# Patient Record
Sex: Female | Born: 1990 | Hispanic: No | Marital: Married | Smoking: Never smoker
Health system: Southern US, Community
[De-identification: ages and names within clinical notes are randomized; demographics above are authoritative.]

## PROBLEM LIST (undated history)

## (undated) DIAGNOSIS — A630 Anogenital (venereal) warts: Secondary | ICD-10-CM

## (undated) DIAGNOSIS — R87629 Unspecified abnormal cytological findings in specimens from vagina: Secondary | ICD-10-CM

## (undated) DIAGNOSIS — N6320 Unspecified lump in the left breast, unspecified quadrant: Secondary | ICD-10-CM

## (undated) HISTORY — PX: COLPOSCOPY: SHX161

## (undated) HISTORY — PX: NO PAST SURGERIES: SHX2092

## (undated) HISTORY — DX: Unspecified abnormal cytological findings in specimens from vagina: R87.629

---

## 2018-09-12 LAB — OB RESULTS CONSOLE HIV ANTIBODY (ROUTINE TESTING): HIV: NONREACTIVE

## 2018-09-12 LAB — OB RESULTS CONSOLE ABO/RH: RH Type: POSITIVE

## 2018-09-12 LAB — OB RESULTS CONSOLE GC/CHLAMYDIA
Chlamydia: NEGATIVE
Gonorrhea: NEGATIVE

## 2018-09-12 LAB — OB RESULTS CONSOLE RUBELLA ANTIBODY, IGM: Rubella: IMMUNE

## 2018-09-12 LAB — OB RESULTS CONSOLE ANTIBODY SCREEN: Antibody Screen: NEGATIVE

## 2018-09-12 LAB — OB RESULTS CONSOLE RPR: RPR: NONREACTIVE

## 2018-09-12 LAB — OB RESULTS CONSOLE HEPATITIS B SURFACE ANTIGEN: Hepatitis B Surface Ag: NEGATIVE

## 2018-09-13 ENCOUNTER — Other Ambulatory Visit (HOSPITAL_COMMUNITY): Payer: Self-pay | Admitting: Nurse Practitioner

## 2018-09-13 DIAGNOSIS — Z3A13 13 weeks gestation of pregnancy: Secondary | ICD-10-CM

## 2018-09-13 DIAGNOSIS — Z369 Encounter for antenatal screening, unspecified: Secondary | ICD-10-CM

## 2018-09-20 ENCOUNTER — Encounter (HOSPITAL_COMMUNITY): Payer: Self-pay | Admitting: *Deleted

## 2018-09-23 ENCOUNTER — Other Ambulatory Visit: Payer: Self-pay | Admitting: Nurse Practitioner

## 2018-09-23 DIAGNOSIS — N6320 Unspecified lump in the left breast, unspecified quadrant: Secondary | ICD-10-CM

## 2018-09-24 ENCOUNTER — Encounter (HOSPITAL_COMMUNITY): Payer: Self-pay

## 2018-09-24 ENCOUNTER — Ambulatory Visit (HOSPITAL_COMMUNITY)
Admission: RE | Admit: 2018-09-24 | Discharge: 2018-09-24 | Disposition: A | Discharge status: Home / Self Care | Payer: Medicaid Other | Source: Ambulatory Visit | Attending: Nurse Practitioner | Admitting: Nurse Practitioner

## 2018-09-24 ENCOUNTER — Other Ambulatory Visit (HOSPITAL_COMMUNITY): Payer: Self-pay | Admitting: Nurse Practitioner

## 2018-09-24 DIAGNOSIS — Z3A13 13 weeks gestation of pregnancy: Secondary | ICD-10-CM

## 2018-09-24 DIAGNOSIS — Z369 Encounter for antenatal screening, unspecified: Secondary | ICD-10-CM

## 2018-09-24 DIAGNOSIS — Z363 Encounter for antenatal screening for malformations: Secondary | ICD-10-CM | POA: Diagnosis not present

## 2018-09-24 HISTORY — DX: Anogenital (venereal) warts: A63.0

## 2018-09-24 HISTORY — DX: Unspecified lump in the left breast, unspecified quadrant: N63.20

## 2018-10-04 ENCOUNTER — Ambulatory Visit
Admission: RE | Admit: 2018-10-04 | Discharge: 2018-10-04 | Disposition: A | Discharge status: Home / Self Care | Payer: Medicaid Other | Source: Ambulatory Visit | Attending: Nurse Practitioner | Admitting: Nurse Practitioner

## 2018-10-04 DIAGNOSIS — N6320 Unspecified lump in the left breast, unspecified quadrant: Secondary | ICD-10-CM

## 2018-11-12 ENCOUNTER — Encounter: Payer: Self-pay | Admitting: *Deleted

## 2018-12-11 NOTE — L&D Delivery Note (Signed)
Patient: Lisa Heath MRN: [id number]  GBS status: Positive, IAP given: PCN   Patient is a 28 y.o. now G1P1 s/p NSVD at [redacted]w[redacted]d, who was admitted for PROM. SROM 32 hours prior to delivery with clear fluid.    Delivery Note At 3:54 PM a viable female was delivered via SVD (Presentation: LOA).  APGAR: 8, 9; weight 7 lb 14.8 oz (3595 g).   Placenta status: spontaneous, intact.  Cord: 3 vessel with the following complications: nuchal x1.   Anesthesia:  Lidocaine 20 cc for repair, Epidural turned off 4 hours prior to delivery  Episiotomy: None Lacerations:  2nd degree perineal, right sulcus  Suture Repair: 3.0 monocryl  Est. Blood Loss (mL):  715   Poor maternal effort with pushing, very disorganized pattern without relation to contractions. Attempted vacuum after pushing with patient for 1 hour (total pushing time close to 3 hours) where head was +3 station but was unable to maintain adequate suction with Kiwi and Mushroom vacuum. Turned patient to left side to push. When returned to supine position, head delivered LOA. Nuchal cord present x1, reduced at periuneum. Shoulder and body delivered in usual fashion. Infant with initial poor tone and no spontaneous cry, NICU team in room, cord cut and clamped immediately and baby taken to the warmer. As soon as cord cut, infant with spontaneous cry. Cord blood drawn. Placenta delivered spontaneously with gentle cord traction. Fundus very boggy despite massage and Pitocin. Patient was given 1000 mg Cytotec rectally and Methergine 0.2 mg IM with significant improvement in tone and bleeding. Perineum and vagina inspected and found to have second degree laceration and left sulcus tear, which was repaired with 3.0 monocryl with good hemostasis achieved.   Mom to postpartum.  Baby to Couplet care / Skin to Skin.  De Hollingshead 04/04/2019, 4:56 PM

## 2018-12-16 ENCOUNTER — Ambulatory Visit (INDEPENDENT_AMBULATORY_CARE_PROVIDER_SITE_OTHER): Payer: Medicaid Other | Admitting: Obstetrics & Gynecology

## 2018-12-16 ENCOUNTER — Encounter: Payer: Self-pay | Admitting: Obstetrics & Gynecology

## 2018-12-16 VITALS — BP 95/62 | HR 92 | Wt 128.8 lb

## 2018-12-16 DIAGNOSIS — R87610 Atypical squamous cells of undetermined significance on cytologic smear of cervix (ASC-US): Secondary | ICD-10-CM | POA: Diagnosis not present

## 2018-12-16 DIAGNOSIS — Z789 Other specified health status: Secondary | ICD-10-CM

## 2018-12-16 DIAGNOSIS — R87810 Cervical high risk human papillomavirus (HPV) DNA test positive: Secondary | ICD-10-CM | POA: Diagnosis not present

## 2018-12-16 NOTE — Progress Notes (Signed)
FHR 142 by doppler

## 2018-12-16 NOTE — Progress Notes (Signed)
   Subjective:    Patient ID: Lisa Heath, female    DOB: [birth year], 28 y.o.   MRN: [id number]  HPI 28 yo P1 in her third trimester referred from the health dept for a colpo due to a ASCUS + HR HPV pap done 10/19. This is her first pap smear. She is not having any obstetric problems today.  Review of Systems     Objective:   Physical Exam Breathing, conversing, and ambulating normally Live interpretor present for exam UPT negative, consent signed, time out done Cervix prepped with acetic acid. Transformation zone seen in its entirety. Colpo adequate. 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 She has a large ectropion. She tolerated the procedure well.     Assessment & Plan:  ASCUS + HR HPV pap, colpo c/w LGSIL Rec repeat pap smear postpartum.

## 2018-12-17 ENCOUNTER — Encounter: Payer: Self-pay | Admitting: *Deleted

## 2019-04-01 ENCOUNTER — Other Ambulatory Visit: Payer: Self-pay | Admitting: Advanced Practice Midwife

## 2019-04-01 ENCOUNTER — Telehealth (HOSPITAL_COMMUNITY): Payer: Self-pay | Admitting: *Deleted

## 2019-04-01 LAB — OB RESULTS CONSOLE GBS: GBS: POSITIVE

## 2019-04-01 NOTE — Telephone Encounter (Signed)
Preadmission screen Interpreter number [phone]

## 2019-04-03 ENCOUNTER — Encounter (HOSPITAL_COMMUNITY): Payer: Self-pay | Admitting: *Deleted

## 2019-04-03 ENCOUNTER — Other Ambulatory Visit: Payer: Self-pay

## 2019-04-03 ENCOUNTER — Inpatient Hospital Stay (HOSPITAL_COMMUNITY)
Admission: AD | Admit: 2019-04-03 | Discharge: 2019-04-06 | DRG: 806 | Disposition: A | Discharge status: Home / Self Care | Payer: Medicaid Other | Attending: Obstetrics & Gynecology | Admitting: Obstetrics & Gynecology

## 2019-04-03 DIAGNOSIS — O99824 Streptococcus B carrier state complicating childbirth: Secondary | ICD-10-CM | POA: Diagnosis present

## 2019-04-03 DIAGNOSIS — O26893 Other specified pregnancy related conditions, third trimester: Secondary | ICD-10-CM | POA: Diagnosis present

## 2019-04-03 DIAGNOSIS — O4202 Full-term premature rupture of membranes, onset of labor within 24 hours of rupture: Secondary | ICD-10-CM | POA: Diagnosis not present

## 2019-04-03 DIAGNOSIS — Z3A40 40 weeks gestation of pregnancy: Secondary | ICD-10-CM

## 2019-04-03 DIAGNOSIS — O480 Post-term pregnancy: Secondary | ICD-10-CM | POA: Diagnosis present

## 2019-04-03 DIAGNOSIS — O99820 Streptococcus B carrier state complicating pregnancy: Secondary | ICD-10-CM

## 2019-04-03 DIAGNOSIS — O4290 Premature rupture of membranes, unspecified as to length of time between rupture and onset of labor, unspecified weeks of gestation: Secondary | ICD-10-CM

## 2019-04-03 DIAGNOSIS — O4292 Full-term premature rupture of membranes, unspecified as to length of time between rupture and onset of labor: Principal | ICD-10-CM | POA: Diagnosis present

## 2019-04-03 DIAGNOSIS — Z3A41 41 weeks gestation of pregnancy: Secondary | ICD-10-CM

## 2019-04-03 DIAGNOSIS — Z8759 Personal history of other complications of pregnancy, childbirth and the puerperium: Secondary | ICD-10-CM | POA: Diagnosis not present

## 2019-04-03 HISTORY — DX: Streptococcus B carrier state complicating pregnancy: O99.820

## 2019-04-03 LAB — CBC
HCT: 35.4 % — ABNORMAL LOW (ref 36.0–46.0)
Hemoglobin: 12 g/dL (ref 12.0–15.0)
MCH: 28.4 pg (ref 26.0–34.0)
MCHC: 33.9 g/dL (ref 30.0–36.0)
MCV: 83.7 fL (ref 80.0–100.0)
Platelets: 157 10*3/uL (ref 150–400)
RBC: 4.23 MIL/uL (ref 3.87–5.11)
RDW: 13.6 % (ref 11.5–15.5)
WBC: 9.1 10*3/uL (ref 4.0–10.5)
nRBC: 0 % (ref 0.0–0.2)

## 2019-04-03 LAB — ABO/RH: ABO/RH(D): O POS

## 2019-04-03 LAB — TYPE AND SCREEN
ABO/RH(D): O POS
Antibody Screen: NEGATIVE

## 2019-04-03 LAB — POCT FERN TEST: POCT Fern Test: POSITIVE

## 2019-04-03 MED ORDER — LACTATED RINGERS IV SOLN
INTRAVENOUS | Status: DC
  Administered 2019-04-03 (×2): via INTRAVENOUS

## 2019-04-03 MED ORDER — ACETAMINOPHEN 325 MG PO TABS
650.0000 mg | ORAL_TABLET | ORAL | Status: DC | PRN
  Administered 2019-04-04: 650 mg via ORAL
  Filled 2019-04-03: qty 2

## 2019-04-03 MED ORDER — OXYCODONE-ACETAMINOPHEN 5-325 MG PO TABS: 2.0000 | ORAL_TABLET | ORAL | Status: DC | PRN

## 2019-04-03 MED ORDER — LIDOCAINE HCL (PF) 1 % IJ SOLN: 30.0000 mL | INTRAMUSCULAR | Status: DC | PRN

## 2019-04-03 MED ORDER — SODIUM CHLORIDE 0.9 % IV SOLN
5.0000 10*6.[IU] | Freq: Once | INTRAVENOUS | Status: AC
  Administered 2019-04-03: 14:00:00 5 10*6.[IU] via INTRAVENOUS
  Filled 2019-04-03: qty 5

## 2019-04-03 MED ORDER — PENICILLIN G 3 MILLION UNITS IVPB - SIMPLE MED
3.0000 10*6.[IU] | INTRAVENOUS | Status: DC
  Administered 2019-04-03 – 2019-04-04 (×4): 3 10*6.[IU] via INTRAVENOUS
  Filled 2019-04-03 (×4): qty 100

## 2019-04-03 MED ORDER — LACTATED RINGERS IV SOLN: 500.0000 mL | INTRAVENOUS | Status: DC | PRN

## 2019-04-03 MED ORDER — OXYTOCIN 40 UNITS IN NORMAL SALINE INFUSION - SIMPLE MED
1.0000 m[IU]/min | INTRAVENOUS | Status: DC
  Administered 2019-04-03: 2 m[IU]/min via INTRAVENOUS
  Filled 2019-04-03: qty 1000

## 2019-04-03 MED ORDER — OXYTOCIN 40 UNITS IN NORMAL SALINE INFUSION - SIMPLE MED: 2.5000 [IU]/h | INTRAVENOUS | Status: DC

## 2019-04-03 MED ORDER — FENTANYL CITRATE (PF) 100 MCG/2ML IJ SOLN
100.0000 ug | INTRAMUSCULAR | Status: DC | PRN
  Administered 2019-04-04: 100 ug via INTRAVENOUS
  Filled 2019-04-03: qty 2

## 2019-04-03 MED ORDER — OXYCODONE-ACETAMINOPHEN 5-325 MG PO TABS: 1.0000 | ORAL_TABLET | ORAL | Status: DC | PRN

## 2019-04-03 MED ORDER — ONDANSETRON HCL 4 MG/2ML IJ SOLN: 4.0000 mg | Freq: Four times a day (QID) | INTRAMUSCULAR | Status: DC | PRN

## 2019-04-03 MED ORDER — OXYTOCIN BOLUS FROM INFUSION
500.0000 mL | Freq: Once | INTRAVENOUS | Status: AC
  Administered 2019-04-04: 500 mL via INTRAVENOUS

## 2019-04-03 MED ORDER — SOD CITRATE-CITRIC ACID 500-334 MG/5ML PO SOLN: 30.0000 mL | ORAL | Status: DC | PRN

## 2019-04-03 MED ORDER — TERBUTALINE SULFATE 1 MG/ML IJ SOLN: 0.2500 mg | Freq: Once | INTRAMUSCULAR | Status: DC | PRN

## 2019-04-03 MED ORDER — PHENYLEPHRINE HCL-NACL 20-0.9 MG/250ML-% IV SOLN
INTRAVENOUS | Status: AC
  Filled 2019-04-03: qty 250

## 2019-04-03 MED ORDER — MISOPROSTOL 50MCG HALF TABLET
50.0000 ug | ORAL_TABLET | ORAL | Status: DC | PRN
  Administered 2019-04-03: 15:00:00 50 ug via BUCCAL
  Filled 2019-04-03: qty 1

## 2019-04-03 MED ORDER — ZOLPIDEM TARTRATE 5 MG PO TABS: 5.0000 mg | ORAL_TABLET | Freq: Every evening | ORAL | Status: DC | PRN

## 2019-04-03 NOTE — Progress Notes (Addendum)
Lisa Heath is a 28 y.o. G1P0 at [redacted]w[redacted]d admitted for SROM today at 0800.  Subjective: Denies pain, desires unmedicated labor.  Objective: BP 106/68   Pulse 79   Temp 98.1 F (36.7 C) (Oral)   Resp 16   Ht 5' 3.5" (1.613 m)   Wt 64 kg   LMP 06/23/2018   BMI 24.59 kg/m  No intake/output data recorded. No intake/output data recorded.  FHT:  FHR: 130 bpm, variability: moderate,  accelerations:  Present,  decelerations:  Absent UC:   none SVE:   Dilation: 1 Effacement (%): 40 Station: Ballotable Exam by:: Thalia Bloodgood, CNM  Labs: Lab Results  Component Value Date   WBC 9.1 04/03/2019   HGB 12.0 04/03/2019   HCT 35.4 (L) 04/03/2019   MCV 83.7 04/03/2019   PLT 157 04/03/2019    Assessment / Plan: SROM today 0800 Category I tracing Vertex by suture GBS +. PCN G 5 million units hung at 1346 Foley bulb and 50 mcg buccal Cytotec administered at 1515. Tolerated well by patient Anticipate NSVD  Calvert Cantor, CNM 04/03/2019, 3:17 PM

## 2019-04-03 NOTE — Progress Notes (Signed)
OB/GYN Faculty Practice: Labor Progress Note  Subjective: Doing well, NAD. Having contractions on toco every minute. States feeling them.   Objective: BP 110/69   Pulse 81   Temp 98.6 F (37 C) (Oral)   Resp 17   Ht 5' 3.5" (1.613 m)   Wt 64 kg   LMP 06/23/2018   BMI 24.59 kg/m  Gen: comfortable appearing, no changes with contractions Dilation: 1 Effacement (%): 40 Cervical Position: Posterior Station: Ballotable Presentation: Vertex Exam by:: Thalia Bloodgood, CNM  Assessment and Plan: 28 y.o. G1P0 [redacted]w[redacted]d here with PROM this morning around 0800.   Labor: PROM this morning. FB placed and now out, received one dose of cytotec around 1515 and still contracting every 1-2 minutes. Does not appear uncomfortable.  -- pain control: would like to be intervention free -- PPH Risk: low  Fetal Well-Being: EFW 6-7lbs by Leopold's. Cephalic by prior check with sutures.  -- Category I - continuous fetal monitoring  -- GBS positive (has received 2 doses)   Laurel S. Earlene Plater, DO OB/GYN Fellow, Faculty Practice  9:18 PM

## 2019-04-03 NOTE — H&P (Addendum)
Lisa Heath is a 28 y.o. female, G1P0 at 40.4 weeks, presenting for SROM at 0800.  She reports clear fluid, fetal movement, and occasional contractions.  [facility] and was supervised for a Low risk pregnancy. Pregnancy and medical history significant for problems as listed below. Patient is GBS Positive and desires an epidural for pain management.  Patient is anticipating a female infant and desires a circumcision.  She opts for oral [facility].    Patient Active Problem List   Diagnosis Date Noted  . GBS (group B Streptococcus carrier), +RV culture, currently pregnant 04/03/2019  . Language barrier affecting health care 12/16/2018  . ASCUS with positive high risk HPV cervical 12/16/2018    History of present pregnancy: Patient entered care at 11.4 weeks.   EDC of 03/30/2019 was established by Definite LMP 06/23/2018.   Anatomy scan:  20.1 weeks, with normal findings and an anterior placenta.   Additional US evaluations:  None Significant prenatal events: US of Left breast for lump.    OB History    Gravida  1   Para      Term      Preterm      AB      Living  0     SAB      TAB      Ectopic      Multiple      Live Births                Past Medical History:  Diagnosis Date  . Left breast mass   . Venereal warts    Past Surgical History:  Procedure Laterality Date  . NO PAST SURGERIES     Family History: family history is not on file. Social History:  reports that she has never smoked. She has never used smokeless tobacco. She reports that she does not drink alcohol or use drugs.   Prenatal Transfer Tool  Maternal Diabetes: No Genetic Screening: Normal Maternal Ultrasounds/Referrals: Normal Fetal Ultrasounds or other Referrals:  None Maternal Substance Abuse:  No Significant Maternal Medications:  None Significant Maternal Lab Results: Lab values include: Group B Strep positive   Maternal Assessment:  ROS:  +Contractions, +LOF, -Vaginal Bleeding, +Fetal Movement  All other systems reviewed and negative.    No Known Allergies   Dilation: Closed Effacement (%): 50 Station: Ballotable Exam by:: Sabas SousJ. Shyann Hefner CNM Blood pressure 112/72, pulse 100, temperature 98.4 F (36.9 C), resp. rate 18, height 5' 3.5" (1.613 m), weight 64 kg, last menstrual period 06/23/2018.  Physical Exam  Constitutional: She is oriented to person, place, and time. She appears well-developed and well-nourished. No distress.  HENT:  Head: Normocephalic and atraumatic.  Eyes: Conjunctivae are normal.  Neck: Normal range of motion.  Cardiovascular: Normal rate, regular rhythm and normal heart sounds.  Respiratory: Effort normal and breath sounds normal.  GI: Soft.  Gravid--fundal height appears AGA, Soft, NT  Genitourinary:    Vagina normal.  Cervix exhibits no motion tenderness, no discharge and no friability.    No vaginal bleeding.  No bleeding in the vagina.    Genitourinary Comments: Sterile Speculum Exam: -Vaginal Vault: Pink mucosa.  Small amt clear mucoid/watery discharge in posterior fornix; fern collected -Cervix:Pink, no lesions, cysts, or polyps.  Appears closed. No active bleeding from os, but active leakage of fluid -Bimanual Exam: Dilation: Closed Effacement (%): 50 Cervical Position: Posterior Station: Ballotable Exam by:: Sabas SousJ. Makelle Marrone CNM    Musculoskeletal: Normal range  of motion.  Neurological: She is alert and oriented to person, place, and time.  Skin: Skin is warm and dry.  Psychiatric: She has a normal mood and affect. Her behavior is normal.    Fetal Assessment: Leopolds: -Pelvis:Adequate -EFW: 7lbs -Presentation:Vertex by BS US-Appears slightly asynclitic   FHR: 145 bpm, Mod Var, -Decels, +Accels UCs:  q1-41min, palpates mild    Assessment IUP at 40.4 weeks Cat I FT SROM GBS Positive Language Barrier-Arabic  Plan: Admit to [employer]  Routine Labor and Delivery Orders per  Protocol  Expectant Mgmt at current Start PCN for GBS prophylaxis  Joellyn Quails, MSN 04/03/2019, 11:29 AM

## 2019-04-03 NOTE — Progress Notes (Signed)
Lisa Heath is a 28 y.o. G1P0 at [redacted]w[redacted]d admitted for SROM today at 0800.  Subjective: Patient reporting "uncomfortable" contraction pain. Declines pain medication at this time.  Objective: BP 109/72   Pulse 81   Temp 98.5 F (36.9 C) (Oral)   Resp 18   Ht 5' 3.5" (1.613 m)   Wt 64 kg   LMP 06/23/2018   BMI 24.59 kg/m  No intake/output data recorded. No intake/output data recorded.  FHT:  FHR: 140 bpm, variability: moderate,  accelerations:  Present,  decelerations:  Absent UC:   regular, every 1-3 minutes SVE:   Dilation: 1 (with foley bulb placement) Effacement (%): 40 Station: Ballotable Exam by:: Thalia Bloodgood, CNM  Labs: Lab Results  Component Value Date   WBC 9.1 04/03/2019   HGB 12.0 04/03/2019   HCT 35.4 (L) 04/03/2019   MCV 83.7 04/03/2019   PLT 157 04/03/2019    Assessment / Plan: Category I tracing Foley bulb dislodged at 1745 S/P Cytotec #1 at 1519 GBS +: PCN 2 million units given at 1742 Defer cervical exam due to rupture Patient desires unmedicated delivery Plan Pitocin augmentation PRN if contractions space out Anticipate SVD  Calvert Cantor, CNM 04/03/2019, 7:26 PM

## 2019-04-03 NOTE — MAU Note (Signed)
Water broke at 8am this morning Clear fluid. Denies any contractions. Good fetal movment

## 2019-04-04 ENCOUNTER — Encounter (HOSPITAL_COMMUNITY): Payer: Medicaid Other

## 2019-04-04 ENCOUNTER — Inpatient Hospital Stay (HOSPITAL_COMMUNITY): Payer: Medicaid Other | Admitting: Anesthesiology

## 2019-04-04 ENCOUNTER — Encounter (HOSPITAL_COMMUNITY): Payer: Self-pay | Admitting: *Deleted

## 2019-04-04 DIAGNOSIS — O4202 Full-term premature rupture of membranes, onset of labor within 24 hours of rupture: Secondary | ICD-10-CM

## 2019-04-04 DIAGNOSIS — Z8759 Personal history of other complications of pregnancy, childbirth and the puerperium: Secondary | ICD-10-CM | POA: Diagnosis not present

## 2019-04-04 DIAGNOSIS — O99824 Streptococcus B carrier state complicating childbirth: Secondary | ICD-10-CM

## 2019-04-04 DIAGNOSIS — Z3A40 40 weeks gestation of pregnancy: Secondary | ICD-10-CM

## 2019-04-04 DIAGNOSIS — O480 Post-term pregnancy: Secondary | ICD-10-CM

## 2019-04-04 LAB — RPR: RPR Ser Ql: NONREACTIVE

## 2019-04-04 MED ORDER — IBUPROFEN 600 MG PO TABS
600.0000 mg | ORAL_TABLET | Freq: Four times a day (QID) | ORAL | Status: DC
  Administered 2019-04-04 – 2019-04-06 (×6): 600 mg via ORAL
  Filled 2019-04-04 (×7): qty 1

## 2019-04-04 MED ORDER — LACTATED RINGERS IV SOLN: 500.0000 mL | Freq: Once | INTRAVENOUS | Status: DC

## 2019-04-04 MED ORDER — ONDANSETRON HCL 4 MG PO TABS: 4.0000 mg | ORAL_TABLET | ORAL | Status: DC | PRN

## 2019-04-04 MED ORDER — ONDANSETRON HCL 4 MG/2ML IJ SOLN: 4.0000 mg | INTRAMUSCULAR | Status: DC | PRN

## 2019-04-04 MED ORDER — SENNOSIDES-DOCUSATE SODIUM 8.6-50 MG PO TABS
2.0000 | ORAL_TABLET | ORAL | Status: DC
  Administered 2019-04-05 (×2): 2 via ORAL
  Filled 2019-04-04 (×2): qty 2

## 2019-04-04 MED ORDER — EPHEDRINE 5 MG/ML INJ: 10.0000 mg | INTRAVENOUS | Status: DC | PRN

## 2019-04-04 MED ORDER — SIMETHICONE 80 MG PO CHEW: 80.0000 mg | CHEWABLE_TABLET | ORAL | Status: DC | PRN

## 2019-04-04 MED ORDER — PHENYLEPHRINE 40 MCG/ML (10ML) SYRINGE FOR IV PUSH (FOR BLOOD PRESSURE SUPPORT)
80.0000 ug | PREFILLED_SYRINGE | INTRAVENOUS | Status: DC | PRN
  Filled 2019-04-04: qty 10

## 2019-04-04 MED ORDER — MISOPROSTOL 200 MCG PO TABS
ORAL_TABLET | ORAL | Status: AC
  Filled 2019-04-04: qty 2

## 2019-04-04 MED ORDER — MEASLES, MUMPS & RUBELLA VAC IJ SOLR: 0.5000 mL | Freq: Once | INTRAMUSCULAR | Status: DC

## 2019-04-04 MED ORDER — DIBUCAINE (PERIANAL) 1 % EX OINT
1.0000 "application " | TOPICAL_OINTMENT | CUTANEOUS | Status: DC | PRN
  Administered 2019-04-05: 1 via RECTAL
  Filled 2019-04-04: qty 28

## 2019-04-04 MED ORDER — DIPHENHYDRAMINE HCL 50 MG/ML IJ SOLN: 12.5000 mg | INTRAMUSCULAR | Status: DC | PRN

## 2019-04-04 MED ORDER — PRENATAL MULTIVITAMIN CH
1.0000 | ORAL_TABLET | Freq: Every day | ORAL | Status: DC
  Administered 2019-04-05: 15:00:00 1 via ORAL
  Filled 2019-04-04 (×2): qty 1

## 2019-04-04 MED ORDER — LIDOCAINE HCL (PF) 1 % IJ SOLN
INTRAMUSCULAR | Status: DC | PRN
  Administered 2019-04-04: 5 mL via EPIDURAL

## 2019-04-04 MED ORDER — SODIUM CHLORIDE (PF) 0.9 % IJ SOLN
INTRAMUSCULAR | Status: DC | PRN
  Administered 2019-04-04: 12 mL/h via EPIDURAL

## 2019-04-04 MED ORDER — METHYLERGONOVINE MALEATE 0.2 MG/ML IJ SOLN
INTRAMUSCULAR | Status: AC
  Administered 2019-04-04: 0.2 mg
  Filled 2019-04-04: qty 1

## 2019-04-04 MED ORDER — COCONUT OIL OIL: 1.0000 "application " | TOPICAL_OIL | Status: DC | PRN

## 2019-04-04 MED ORDER — FENTANYL-BUPIVACAINE-NACL 0.5-0.125-0.9 MG/250ML-% EP SOLN
12.0000 mL/h | EPIDURAL | Status: DC | PRN
  Filled 2019-04-04: qty 250

## 2019-04-04 MED ORDER — ACETAMINOPHEN 325 MG PO TABS
650.0000 mg | ORAL_TABLET | ORAL | Status: DC | PRN
  Administered 2019-04-06: 07:00:00 650 mg via ORAL
  Filled 2019-04-04: qty 2

## 2019-04-04 MED ORDER — LIDOCAINE HCL (PF) 1 % IJ SOLN
INTRAMUSCULAR | Status: AC
  Filled 2019-04-04: qty 30

## 2019-04-04 MED ORDER — MISOPROSTOL 200 MCG PO TABS
ORAL_TABLET | ORAL | Status: AC
  Administered 2019-04-04: 1000 ug
  Filled 2019-04-04: qty 3

## 2019-04-04 MED ORDER — TETANUS-DIPHTH-ACELL PERTUSSIS 5-2.5-18.5 LF-MCG/0.5 IM SUSP: 0.5000 mL | Freq: Once | INTRAMUSCULAR | Status: DC

## 2019-04-04 MED ORDER — ZOLPIDEM TARTRATE 5 MG PO TABS: 5.0000 mg | ORAL_TABLET | Freq: Every evening | ORAL | Status: DC | PRN

## 2019-04-04 MED ORDER — WITCH HAZEL-GLYCERIN EX PADS
1.0000 "application " | MEDICATED_PAD | CUTANEOUS | Status: DC | PRN
  Administered 2019-04-05: 1 via TOPICAL

## 2019-04-04 MED ORDER — DIPHENHYDRAMINE HCL 25 MG PO CAPS: 25.0000 mg | ORAL_CAPSULE | Freq: Four times a day (QID) | ORAL | Status: DC | PRN

## 2019-04-04 MED ORDER — PHENYLEPHRINE 40 MCG/ML (10ML) SYRINGE FOR IV PUSH (FOR BLOOD PRESSURE SUPPORT): 80.0000 ug | PREFILLED_SYRINGE | INTRAVENOUS | Status: DC | PRN

## 2019-04-04 MED ORDER — BENZOCAINE-MENTHOL 20-0.5 % EX AERO
1.0000 "application " | INHALATION_SPRAY | CUTANEOUS | Status: DC | PRN
  Administered 2019-04-05: 1 via TOPICAL
  Filled 2019-04-04: qty 56

## 2019-04-04 NOTE — Progress Notes (Signed)
OB/GYN Faculty Practice: Labor Progress Note  Subjective: Comfortable with epidural. Did short trial of pushing without much fetal descent, dense epidural.   Objective: BP (!) 99/53   Pulse 75   Temp 99.6 F (37.6 C) (Oral)   Resp 16   Ht 5' 3.5" (1.613 m)   Wt 64 kg   LMP 06/23/2018   SpO2 100%   BMI 24.59 kg/m  Gen: comfortable appearing Dilation: 10 Dilation Complete Date: 04/04/19 Dilation Complete Time: 0625 Effacement (%): 80 Cervical Position: Middle Station: -1, -2 Presentation: Vertex Exam by:: Cyprus McHenry RN  Assessment and Plan: 28 y.o. G1P0 [redacted]w[redacted]d here with PROM this morning around 0800.   Labor: Complete at 0615. Laboring down. Will plan to AROM with next check, trial pushing again.  -- pain control: epidural  -- PPH Risk: low  Fetal Well-Being: EFW 6-7lbs by Leopold's. Cephalic by prior check with sutures.  -- Category I - continuous fetal monitoring  -- GBS positive (has received 2 doses)   Laurel S. Earlene Plater, DO OB/GYN Fellow, Faculty Practice  7:34 AM

## 2019-04-04 NOTE — Discharge Summary (Signed)
OB Discharge Summary    Patient Name: Lisa Heath DOB: 07-27-1991 MRN: [id number]  Date of admission: 04/03/2019 Delivering MD: Arvilla MarketWALLACE, CATHERINE LAUREN   Date of discharge: 04/06/2019  Admitting diagnosis: Water broke Intrauterine pregnancy: 8320w5d     Secondary diagnosis:  Active Problems:   GBS (group B Streptococcus carrier), +RV culture, currently pregnant   Post term pregnancy at [redacted] weeks gestation   NSVD (normal spontaneous vaginal delivery)   Postpartum hemorrhage    Discharge diagnosis: Term Pregnancy Delivered                                Postpartum procedures:none Augmentation: Pitocin Complications: Postpartum hemorrhage with EBL 800cc - received cytotec and methergine   Hospital course:  Onset of Labor With Vaginal Delivery     28 y.o. yo G1P0 at 4520w5d was admitted in Latent Labor on 04/03/2019. Patient had a labor course notable for prolonged second stage, prolonged ROM (32 hours). Delivery complicated by poor maternal effort and attempt at vacuum, 2nd degree perineal laceration repaired.  Membrane Rupture Time/Date: 8:00 AM ,04/03/2019   Intrapartum Procedures: Episiotomy: None [1]                                         Lacerations:  2nd degree [3];Sulcus [9]  Patient had a delivery of a Viable infant. 04/04/2019  Information for the patient's newborn:  Eyvonne Mechanicl Helli, Boy Sakai [[id number]][[id number]]  Delivery Method: Vaginal, Failed Vacuum (Extractor)(Filed from Delivery Summary)  Patient had an uncomplicated postpartum course.  She is ambulating, tolerating a regular diet, passing flatus, and urinating well. Patient is discharged home in stable condition on 04/06/19.  Physical exam  Vitals:   04/05/19 0938 04/05/19 1500 04/05/19 2220 04/06/19 0628  BP: (!) 93/57 (!) 90/55 103/63 94/61  Pulse:  90 83 69  Resp:  18 18 18   Temp:  97.9 F (36.6 C) 98.3 F (36.8 C) 98.1 F (36.7 C)  TempSrc:  Oral Oral Oral  SpO2:   100%   Weight:      Height:       General:  tired-appearing, NAD Lochia: appropriate Uterine Fundus: firm Incision: N/A DVT Evaluation: No significant calf/ankle edema  Labs: Lab Results  Component Value Date   WBC 9.1 04/03/2019   HGB 12.0 04/03/2019   HCT 35.4 (L) 04/03/2019   MCV 83.7 04/03/2019   PLT 157 04/03/2019   No flowsheet data found.  Discharge instruction: per After Visit Summary and "Baby and Me Booklet".  After visit meds:  Allergies as of 04/06/2019   No Known Allergies     Medication List    TAKE these medications   ibuprofen 800 MG tablet Commonly known as:  ADVIL Take 1 tablet (800 mg total) by mouth 3 (three) times daily.   PNV PO Take 1 tablet by mouth daily.   senna-docusate 8.6-50 MG tablet Commonly known as:  Senokot-S Take 2 tablets by mouth at bedtime as needed for mild constipation.       Diet: routine diet Activity: Advance as tolerated. Pelvic rest for 6 weeks.   Outpatient follow up:4 weeks Follow up Appt:No future appointments. Encouraged patient to call health department to schedule visit.   Postpartum contraception: Progesterone only pills  Newborn Data: Live born female  Birth Weight: 7 lb 14.8 oz (3595 g) APGAR: 8,  9  Newborn Delivery   Birth date/time:  04/04/2019 15:54:00 Delivery type:  Vaginal, Vacuum (Extractor)    Baby Feeding: Breast Disposition:home with mother  04/06/2019 Tamera Stands, DO

## 2019-04-04 NOTE — Progress Notes (Signed)
Proceed with vacuum delivery  Team/OR notified.

## 2019-04-04 NOTE — Anesthesia Procedure Notes (Addendum)
Epidural Patient location during procedure: OB Start time: 04/04/2019 1:46 AM End time: 04/04/2019 2:06 AM  Staffing Anesthesiologist: Trevor Iha, MD Performed: anesthesiologist   Preanesthetic Checklist Completed: patient identified, site marked, surgical consent, pre-op evaluation, timeout performed, IV checked, risks and benefits discussed and monitors and equipment checked  Epidural Patient position: sitting Prep: site prepped and draped and DuraPrep Patient monitoring: continuous pulse ox and blood pressure Approach: midline Location: L3-L4 Injection technique: LOR air  Needle:  Needle type: Tuohy  Needle gauge: 17 G Needle length: 9 cm and 9 Needle insertion depth: 6 cm Catheter type: closed end flexible Catheter size: 19 Gauge Catheter at skin depth: 11 cm Test dose: negative  Assessment Events: blood not aspirated, injection not painful, no injection resistance, negative IV test and no paresthesia  Additional Notes Patient identified. Risks/Benefits/Options discussed with patient including but not limited to bleeding, infection, nerve damage, paralysis, failed block, incomplete pain control, headache, blood pressure changes, nausea, vomiting, reactions to medication both or allergic, itching and postpartum back pain. Confirmed with bedside nurse the patient's most recent platelet count. Confirmed with patient that they are not currently taking any anticoagulation, have any bleeding history or any family history of bleeding disorders. Patient expressed understanding and wished to proceed. All questions were answered. Sterile technique was used throughout the entire procedure. Please see nursing notes for vital signs. Test dose was given through epidural needle and negative prior to continuing to dose epidural or start infusion. Warning signs of high block given to the patient including shortness of breath, tingling/numbness in hands, complete motor block, or any  concerning symptoms with instructions to call for help. Patient was given instructions on fall risk and not to get out of bed. All questions and concerns addressed with instructions to call with any issues. 1 Attempt (S) . Patient tolerated procedure well.

## 2019-04-04 NOTE — Anesthesia Preprocedure Evaluation (Signed)
Anesthesia Evaluation  Patient identified by MRN, date of birth, ID band  Reviewed: Allergy & Precautions, NPO status , Patient's Chart, lab work & pertinent test results  Airway Mallampati: II  TM Distance: >3 FB Neck ROM: Full    Dental no notable dental hx. (+) Teeth Intact   Pulmonary neg pulmonary ROS,    Pulmonary exam normal breath sounds clear to auscultation       Cardiovascular Exercise Tolerance: Good Normal cardiovascular exam Rhythm:Regular Rate:Normal     Neuro/Psych negative neurological ROS     GI/Hepatic negative GI ROS, Neg liver ROS,   Endo/Other    Renal/GU      Musculoskeletal   Abdominal   Peds  Hematology Hgb 12.0 Plt 157   Anesthesia Other Findings   Reproductive/Obstetrics (+) Pregnancy                             Anesthesia Physical Anesthesia Plan  ASA: II  Anesthesia Plan: Epidural   Post-op Pain Management:    Induction:   PONV Risk Score and Plan:   Airway Management Planned:   Additional Equipment:   Intra-op Plan:   Post-operative Plan:   Informed Consent: I have reviewed the patients History and Physical, chart, labs and discussed the procedure including the risks, benefits and alternatives for the proposed anesthesia with the patient or authorized representative who has indicated his/her understanding and acceptance.       Plan Discussed with:   Anesthesia Plan Comments: (G1P0 Arabic Speaking )        Anesthesia Quick Evaluation

## 2019-04-04 NOTE — Progress Notes (Signed)
OB/GYN Faculty Practice: Labor Progress Note  Subjective: Just got epidural, getting more comfortable.   Objective: BP 118/76   Pulse 86   Temp 98.1 F (36.7 C) (Oral)   Resp 16   Ht 5' 3.5" (1.613 m)   Wt 64 kg   LMP 06/23/2018   SpO2 100%   BMI 24.59 kg/m  Gen: comfortable appearing Dilation: 4 Effacement (%): 50 Cervical Position: Posterior Station: -2 Presentation: Vertex Exam by:: Dr.L Earlene Heath  Assessment and Plan: 28 y.o. G1P0 [redacted]w[redacted]d here with PROM this morning around 0800.   Labor: Started pitocin around 2200 given not much interval change, became much more uncomfortable and will continue to titrate per protocol.  -- pain control: epidural  -- PPH Risk: low  Fetal Well-Being: EFW 6-7lbs by Leopold's. Cephalic by prior check with sutures.  -- Category I - continuous fetal monitoring  -- GBS positive (has received 2 doses)   Lisa Pruden S. Earlene Plater, DO OB/GYN Fellow, Faculty Practice  2:28 AM

## 2019-04-05 NOTE — Progress Notes (Addendum)
Patient Diastolic  blood pressure running  Low 90s, patient is asymptomatic and denies any lightheadedness or dizziness. Fluids offered and will continue to monitor, and notify oncoming RN

## 2019-04-05 NOTE — Anesthesia Postprocedure Evaluation (Signed)
Anesthesia Post Note  Patient: Lisa Heath  Procedure(s) Performed: AN AD HOC LABOR EPIDURAL     Patient location during evaluation: Mother Baby Anesthesia Type: Epidural Level of consciousness: awake and alert Pain management: pain level controlled Vital Signs Assessment: post-procedure vital signs reviewed and stable Respiratory status: spontaneous breathing, nonlabored ventilation and respiratory function stable Cardiovascular status: stable Postop Assessment: no headache, no backache and able to ambulate Anesthetic complications: no    Last Vitals:  Vitals:   04/05/19 0028 04/05/19 0541  BP: (!) 93/55 (!) 91/58  Pulse: 76 73  Resp: 18 18  Temp: 36.8 C 36.8 C  SpO2:      Last Pain:  Vitals:   04/05/19 0542  TempSrc:   PainSc: 0-No pain   Pain Goal: Patients Stated Pain Goal: 0 (04/03/19 1745)                 Sherrie George Paullette Mckain

## 2019-04-05 NOTE — Progress Notes (Signed)
Rn used [employer] interpreter [phone] for educational purposes.

## 2019-04-05 NOTE — Progress Notes (Signed)
Post Partum Day 1 Subjective: Doing well, somewhat tired. Breastfeeding is going well. Bleeding is about like a period, pain well-controlled.   Objective: Blood pressure (!) 91/58, pulse 73, temperature 98.2 F (36.8 C), temperature source Oral, resp. rate 18, height 5' 3.5" (1.613 m), weight 64 kg, last menstrual period 06/23/2018, SpO2 99 %, unknown if currently breastfeeding.  Physical Exam:  General: alert, well-appearing, NAD Lochia: appropriate Uterine Fundus: firm Incision: n/a DVT Evaluation: No significant calf/ankle edema.  Recent Labs    04/03/19 1235  HGB 12.0  HCT 35.4*    Assessment/Plan: Plan for discharge tomorrow  Needs MMR postpartum - ordered to be given Continue lactation support    LOS: 2 days   Lisa Bow, DO 04/05/2019, 8:42 AM

## 2019-04-06 ENCOUNTER — Inpatient Hospital Stay (HOSPITAL_COMMUNITY): Payer: Medicaid Other

## 2019-04-06 MED ORDER — IBUPROFEN 800 MG PO TABS: 800.0000 mg | ORAL_TABLET | Freq: Three times a day (TID) | ORAL | 0 refills | Status: DC

## 2019-04-06 MED ORDER — SENNOSIDES-DOCUSATE SODIUM 8.6-50 MG PO TABS: 2.0000 | ORAL_TABLET | Freq: Every evening | ORAL | Status: DC | PRN

## 2019-04-06 NOTE — Lactation Note (Signed)
This note was copied from a baby's chart. Lactation Consultation Note  Patient Name: Lisa Heath [facility] Today's Date: 04/06/2019 Reason for consult: Follow-up assessment;Term;Primapara;1st time breastfeeding  P1 mother whose infant is now 63 hours old.  Arabic interpreter, Lisa Heath [phone]) used for interpretation.  Baby was sleeping in mother's arms when I arrived.  Mother had no questions/concerns related to breast feeding.  Per mother, baby was cluster feeding last night.  Reassured her that this is typical and to continue to feed with cues.  Discussed milk coming to volume.  Engorgement prevention/treatment reviewed.  Offered a manual pump but mother is not interested.  She has our OP phone number for questions/concerns after discharge.  Father present.   Maternal Data Formula Feeding for Exclusion: Yes Reason for exclusion: Mother's choice to formula and breast feed on admission Has patient been taught Hand Expression?: Yes Does the patient have breastfeeding experience prior to this delivery?: No  Feeding Feeding Type: Breast Fed  LATCH Score                   Interventions    Lactation Tools Discussed/Used     Consult Status Consult Status: Complete    Lisa Heath 04/06/2019, 8:20 AM

## 2019-08-01 ENCOUNTER — Emergency Department (HOSPITAL_COMMUNITY)
Admission: EM | Admit: 2019-08-01 | Discharge: 2019-08-01 | Disposition: A | Discharge status: Home / Self Care | Payer: Medicaid Other | Attending: Emergency Medicine | Admitting: Emergency Medicine

## 2019-08-01 ENCOUNTER — Other Ambulatory Visit: Payer: Self-pay

## 2019-08-01 ENCOUNTER — Encounter (HOSPITAL_COMMUNITY): Payer: Self-pay

## 2019-08-01 DIAGNOSIS — N939 Abnormal uterine and vaginal bleeding, unspecified: Secondary | ICD-10-CM | POA: Diagnosis not present

## 2019-08-01 LAB — COMPREHENSIVE METABOLIC PANEL
ALT: 17 U/L (ref 0–44)
AST: 18 U/L (ref 15–41)
Albumin: 3.9 g/dL (ref 3.5–5.0)
Alkaline Phosphatase: 60 U/L (ref 38–126)
Anion gap: 9 (ref 5–15)
BUN: 8 mg/dL (ref 6–20)
CO2: 23 mmol/L (ref 22–32)
Calcium: 8.9 mg/dL (ref 8.9–10.3)
Chloride: 106 mmol/L (ref 98–111)
Creatinine, Ser: 0.53 mg/dL (ref 0.44–1.00)
GFR calc Af Amer: >60 mL/min (ref >60)
GFR calc non Af Amer: >60 mL/min (ref >60)
Glucose, Bld: 106 mg/dL — ABNORMAL HIGH (ref 70–99)
Potassium: 3.9 mmol/L (ref 3.5–5.1)
Sodium: 138 mmol/L (ref 135–145)
Total Bilirubin: 0.7 mg/dL (ref 0.3–1.2)
Total Protein: 7.1 g/dL (ref 6.5–8.1)

## 2019-08-01 LAB — CBC WITH DIFFERENTIAL/PLATELET
Abs Immature Granulocytes: 0.02 10*3/uL (ref 0.00–0.07)
Basophils Absolute: 0 10*3/uL (ref 0.0–0.1)
Basophils Relative: 0 %
Eosinophils Absolute: 0.1 10*3/uL (ref 0.0–0.5)
Eosinophils Relative: 1 %
HCT: 35.7 % — ABNORMAL LOW (ref 36.0–46.0)
Hemoglobin: 11.4 g/dL — ABNORMAL LOW (ref 12.0–15.0)
Immature Granulocytes: 0 %
Lymphocytes Relative: 21 %
Lymphs Abs: 1.4 10*3/uL (ref 0.7–4.0)
MCH: 26.6 pg (ref 26.0–34.0)
MCHC: 31.9 g/dL (ref 30.0–36.0)
MCV: 83.2 fL (ref 80.0–100.0)
Monocytes Absolute: 0.5 10*3/uL (ref 0.1–1.0)
Monocytes Relative: 7 %
Neutro Abs: 5 10*3/uL (ref 1.7–7.7)
Neutrophils Relative %: 71 %
Platelets: 205 10*3/uL (ref 150–400)
RBC: 4.29 MIL/uL (ref 3.87–5.11)
RDW: 13.7 % (ref 11.5–15.5)
WBC: 7 10*3/uL (ref 4.0–10.5)
nRBC: 0 % (ref 0.0–0.2)

## 2019-08-01 LAB — URINALYSIS, ROUTINE W REFLEX MICROSCOPIC
Bilirubin Urine: NEGATIVE
Glucose, UA: NEGATIVE mg/dL
Ketones, ur: NEGATIVE mg/dL
Leukocytes,Ua: NEGATIVE
Nitrite: NEGATIVE
Protein, ur: NEGATIVE mg/dL
RBC / HPF: >50 RBC/hpf — ABNORMAL HIGH (ref 0–5)
Specific Gravity, Urine: 1.008 (ref 1.005–1.030)
pH: 5 (ref 5.0–8.0)

## 2019-08-01 NOTE — Discharge Instructions (Addendum)
Follow-up with your doctor, call on Monday for an appointment.  Return to the ER for heavy vaginal bleeding (bleeding through 2 pads in less than 2 hours).

## 2019-08-01 NOTE — ED Notes (Signed)
Patient verbalizes understanding of discharge instructions. Opportunity for questioning and answers were provided. Armband removed by staff, pt discharged from ED ambulatory.   

## 2019-08-01 NOTE — ED Notes (Signed)
PT speaks Arabic. Translator at bedside.

## 2019-08-01 NOTE — ED Provider Notes (Signed)
[facility] EMERGENCY DEPARTMENT Provider Note   CSN: [id number] Arrival date & time: 08/01/19  1715     History   Chief Complaint No chief complaint on file.   HPI Lisa Heath is a 28 y.o. female.     28yo female presents with complaint of vaginal bleeding and fever. Uterine biopsy 2 days ago, fever (100.7 yesterday, 100.11 today) and vaginal bleeding onset yesterday, passing large clots. Patient last took Tylenol at 6AM. Arrives to the ER afebrile. Reports minimal abdominal discomfort. Denies chills, sweats, cough, congestion. Patient is breastfeeding.   The history is limited by a language barrier. A language interpreter was used (arabic).    Past Medical History:  Diagnosis Date  . Left breast mass   . Venereal warts     Patient Active Problem List   Diagnosis Date Noted  . NSVD (normal spontaneous vaginal delivery) 04/04/2019  . Postpartum hemorrhage 04/04/2019  . GBS (group B Streptococcus carrier), +RV culture, currently pregnant 04/03/2019  . Post term pregnancy at [redacted] weeks gestation 04/03/2019  . Language barrier affecting health care 12/16/2018  . ASCUS with positive high risk HPV cervical 12/16/2018    Past Surgical History:  Procedure Laterality Date  . NO PAST SURGERIES       OB History    Gravida  1   Para  1   Term  1   Preterm      AB      Living  1     SAB      TAB      Ectopic      Multiple  0   Live Births  1            Home Medications    Prior to Admission medications   Medication Sig Start Date End Date Taking? Authorizing Provider  ibuprofen (ADVIL) 800 MG tablet Take 1 tablet (800 mg total) by mouth 3 (three) times daily. 04/06/19   Glenice Bow, DO  Prenatal Vit w/Fe-Methylfol-FA (PNV PO) Take 1 tablet by mouth daily.     [provider]  senna-docusate (SENOKOT-S) 8.6-50 MG tablet Take 2 tablets by mouth at bedtime as needed for mild constipation. 04/06/19   Glenice Bow,  DO    Family History History reviewed. No pertinent family history.  Social History Social History   Tobacco Use  . Smoking status: Never Smoker  . Smokeless tobacco: Never Used  Substance Use Topics  . Alcohol use: Never    Frequency: Never  . Drug use: Never     Allergies   Patient has no known allergies.   Review of Systems Review of Systems  Constitutional: Positive for fever. Negative for chills and diaphoresis.  HENT: Negative for congestion.   Respiratory: Negative for cough and shortness of breath.   Gastrointestinal: Negative for abdominal pain, constipation, diarrhea, nausea and vomiting.  Genitourinary: Positive for vaginal bleeding. Negative for pelvic pain.  Musculoskeletal: Negative for arthralgias and myalgias.  Skin: Negative for rash and wound.  Allergic/Immunologic: Negative for immunocompromised state.  Neurological: Negative for dizziness and weakness.  Psychiatric/Behavioral: Negative for confusion.  All other systems reviewed and are negative.    Physical Exam Updated Vital Signs BP 108/81 (BP Location: Right Arm)   Pulse 93   Temp (!) 97.4 F (36.3 C) (Oral)   Resp 14   SpO2 98%   Physical Exam Vitals signs and nursing note reviewed. Exam conducted with a chaperone present.  Constitutional:  General: She is not in acute distress.    Appearance: She is well-developed. She is not diaphoretic.  HENT:     Head: Normocephalic and atraumatic.  Cardiovascular:     Rate and Rhythm: Normal rate and regular rhythm.     Pulses: Normal pulses.     Heart sounds: Normal heart sounds.  Pulmonary:     Effort: Pulmonary effort is normal.     Breath sounds: Normal breath sounds.  Abdominal:     Tenderness: There is no abdominal tenderness.  Genitourinary:    Comments: Small amount of blood in vaginal vault Skin:    General: Skin is warm and dry.     Findings: No erythema or rash.  Neurological:     Mental Status: She is alert and oriented  to person, place, and time.  Psychiatric:        Behavior: Behavior normal.      ED Treatments / Results  Labs (all labs ordered are listed, but only abnormal results are displayed) Labs Reviewed  CBC WITH DIFFERENTIAL/PLATELET - Abnormal; Notable for the following components:      Result Value   Hemoglobin 11.4 (*)    HCT 35.7 (*)    All other components within normal limits  COMPREHENSIVE METABOLIC PANEL - Abnormal; Notable for the following components:   Glucose, Bld 106 (*)    All other components within normal limits  URINALYSIS, ROUTINE W REFLEX MICROSCOPIC - Abnormal; Notable for the following components:   Hgb urine dipstick LARGE (*)    RBC / HPF >50 (*)    Bacteria, UA RARE (*)    All other components within normal limits    EKG None  Radiology No results found.  Procedures Procedures (including critical care time)  Medications Ordered in ED Medications - No data to display   Initial Impression / Assessment and Plan / ED Course  I have reviewed the triage vital signs and the nursing notes.  Pertinent labs & imaging results that were available during my care of the patient were reviewed by me and considered in my medical decision making (see chart for details).  Clinical Course as of Jul 31 2154  Fri Aug 01, 2019  [id number] 28 year old female presents with complaint of vaginal bleeding and fever.  Patient ports having uterine biopsy 2 days ago with a fever of 100.11 at home today.  Patient has not had any Tylenol or other medications since 6 AM, arrives in the emergency room afebrile and well-appearing.  On exam patient's abdomen is soft and nontender, she has a small amount of blood in the vaginal vault, no active or heavy bleeding.  Lab work is reassuring, CBC without significant changes, no significant anemia today.  CMP is unremarkable, urinalysis positive for blood likely contaminant.  Vital signs are stable.  Recommend patient return to emergency room for heavy  bleeding or worsening or concerning symptoms otherwise contact her provider on Monday for recheck.   [LM]    Clinical Course User Index [LM] Jeannie FendMurphy, Laura A, PA-C      Final Clinical Impressions(s) / ED Diagnoses   Final diagnoses:  Vaginal bleeding    ED Discharge Orders    None       Alden HippMurphy, Laura A, PA-C 08/01/19 2155    Milagros Lollykstra, Richard S, MD 08/02/19 [phone]

## 2019-08-01 NOTE — ED Triage Notes (Signed)
Pt is non-english speaking, triage done using interpreter. Presents to ED after a pap smear 2 days ago. After the pap smear she developed a fever and bleeding. Fever has been controlled by antipyretic.

## 2020-02-18 ENCOUNTER — Ambulatory Visit (INDEPENDENT_AMBULATORY_CARE_PROVIDER_SITE_OTHER): Payer: Medicaid Other | Admitting: Allergy and Immunology

## 2020-02-18 ENCOUNTER — Encounter: Payer: Self-pay | Admitting: Allergy and Immunology

## 2020-02-18 ENCOUNTER — Other Ambulatory Visit: Payer: Self-pay

## 2020-02-18 VITALS — BP 108/64 | HR 85 | Resp 16 | Ht 63.0 in | Wt 114.0 lb

## 2020-02-18 DIAGNOSIS — L500 Allergic urticaria: Secondary | ICD-10-CM

## 2020-02-18 DIAGNOSIS — T7840XA Allergy, unspecified, initial encounter: Secondary | ICD-10-CM

## 2020-02-18 DIAGNOSIS — T783XXA Angioneurotic edema, initial encounter: Secondary | ICD-10-CM

## 2020-02-18 DIAGNOSIS — J3089 Other allergic rhinitis: Secondary | ICD-10-CM

## 2020-02-18 HISTORY — DX: Angioneurotic edema, initial encounter: T78.3XXA

## 2020-02-18 HISTORY — DX: Allergy, unspecified, initial encounter: T78.40XA

## 2020-02-18 HISTORY — DX: Other allergic rhinitis: J30.89

## 2020-02-18 MED ORDER — FAMOTIDINE 20 MG PO TABS: 20.0000 mg | ORAL_TABLET | Freq: Two times a day (BID) | ORAL | 5 refills | Status: DC | PRN

## 2020-02-18 MED ORDER — LEVOCETIRIZINE DIHYDROCHLORIDE 5 MG PO TABS: 5.0000 mg | ORAL_TABLET | Freq: Every day | ORAL | 5 refills | Status: DC | PRN

## 2020-02-18 NOTE — Progress Notes (Signed)
New Patient Note  RE: Lisa Heath MRN: [id number] DOB: [birth year] Date of Office Visit: 02/18/2020  Referring provider: No ref. provider found Primary care provider: Patient, No Pcp Per  Chief Complaint: Urticaria and Angioedema  History of present illness: Lisa Heath is a 29 y.o. female presenting today for evaluation of rash.  She is accompanied today by an interpreter who assists with the history.  Over the past 4 years, Lisa Heath has experienced recurrent episodes of hives. The hives have appeared at different times over her entire body.  The lesions are described as erythematous, raised, and pruritic.  Individual hives last less than 24 hours without leaving residual pigmentation or bruising. She has experienced associated angioedema of the lips and eyelids but denies concomitant cardiopulmonary or GI symptoms. She has not experienced unexpected weight loss, recurrent fevers or drenching night sweats. No specific medication, food, skin care product, detergent, soap, or other environmental triggers have been identified.  The hives were quiesced and during pregnancy, however returned after delivering her child.  The symptoms do not seem to correlate with NSAIDs use or emotional stress. She did not have symptoms consistent with a respiratory tract infection at the time of symptom onset. Lisa Heath has tried to control symptoms with OTC antihistamines which have offered excellent temporary relief. She has not been evaluated and treated in the emergency department for these symptoms. Skin biopsy has not been performed. Recently, she has been experiencing hives a few times a week.  She notes that she had episodes of hives as a child, however they went into remission until several years ago.  Assessment and plan: Recurrent urticaria Unclear etiology.  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  NSAIDs and emotional stress commonly exacerbate urticaria but are not the underlying  etiology in this case. Physical urticarias are negative by history (i.e. pressure-induced, temperature, vibration, solar, etc.). History and lesions are not consistent with urticaria pigmentosa so I am not suspicious for mastocytosis. There are no concomitant symptoms concerning for anaphylaxis or constitutional symptoms worrisome for an underlying malignancy. We will rule out other potential etiologies with labs. For symptom relief, patient is to take oral antihistamines as directed.  The following labs have been ordered: FCeRI antibody, anti-thyroglobulin antibody, thyroid peroxidase antibody, tryptase, CBC, CMP, ESR, ANA, and alpha-gal panel.  The patient will be called with further recommendations after lab results have returned.  Instructions have been discussed and provided for H1/H2 receptor blockade with titration to find lowest effective dose.  A prescription has been provided for levocetirizine (Xyzal), 5 mg daily as needed.  A prescription has been provided for famotidine (Pepcid), 47m twice daily as needed.  Should there be a significant increase or change in symptoms, a journal is to be kept recording any foods eaten, beverages consumed, medications taken within a 6 hour period prior to the onset of symptoms, as well as record activities being performed, and environmental conditions. For any symptoms concerning for anaphylaxis, 911 is to be called immediately.  Angioedema Associated angioedema occurs in up to 50% of patients with chronic urticaria.  Treatment/diagnostic plan as outlined above.   Meds ordered this encounter  Medications  . levocetirizine (XYZAL) 5 MG tablet    Sig: Take 1 tablet (5 mg total) by mouth daily as needed.    Dispense:  30 tablet    Refill:  5  . famotidine (PEPCID) 20 MG tablet    Sig: Take 1 tablet (20 mg total) by mouth 2 (two) times  daily as needed for heartburn or indigestion.    Dispense:  60 tablet    Refill:  5     Diagnostics: Environmental skin testing: Interpretation was hindered by dermatographia. Food allergen skin testing: Interpretation was hindered by dermatographia.    Physical examination: Blood pressure 108/64, pulse 85, resp. rate 16, height '5\' 3"'  (1.6 m), weight 114 lb (51.7 kg), SpO2 98 %, unknown if currently breastfeeding.  General: Alert, interactive, in no acute distress. Neck: Supple without lymphadenopathy. Lungs: Clear to auscultation without wheezing, rhonchi or rales. CV: Normal S1, S2 without murmurs. Abdomen: Nondistended, nontender. Skin: Scattered erythematous urticarial type lesions primarily located right hand , nonvesicular. Extremities:  No clubbing, cyanosis or edema. Neuro:   Grossly intact.  Review of systems:  Review of systems negative except as noted in HPI / PMHx or noted below: Review of Systems  Constitutional: Negative.   HENT: Negative.   Eyes: Negative.   Respiratory: Negative.   Cardiovascular: Negative.   Gastrointestinal: Negative.   Genitourinary: Negative.   Musculoskeletal: Negative.   Skin: Negative.   Neurological: Negative.   Endo/Heme/Allergies: Negative.   Psychiatric/Behavioral: Negative.     Past medical history:  Past Medical History:  Diagnosis Date  . Left breast mass   . Venereal warts     Past surgical history:  Past Surgical History:  Procedure Laterality Date  . NO PAST SURGERIES      Family history: Family History  Problem Relation Age of Onset  . Asthma Neg Hx   . Allergic rhinitis Neg Hx   . Eczema Neg Hx   . Urticaria Neg Hx   . Immunodeficiency Neg Hx   . Angioedema Neg Hx     Social history: Social History   Socioeconomic History  . Marital status: Married    Spouse name: Not on file  . Number of children: Not on file  . Years of education: Not on file  . Highest education level: Not on file  Occupational History  . Not on file  Tobacco Use  . Smoking status: Never Smoker  .  Smokeless tobacco: Never Used  Substance and Sexual Activity  . Alcohol use: Never  . Drug use: Never  . Sexual activity: Yes    Birth control/protection: None  Other Topics Concern  . Not on file  Social History Narrative  . Not on file   Social Determinants of Health   Financial Resource Strain:   . Difficulty of Paying Living Expenses: Not on file  Food Insecurity:   . Worried About Charity fundraiser in the Last Year: Not on file  . Ran Out of Food in the Last Year: Not on file  Transportation Needs:   . Lack of Transportation (Medical): Not on file  . Lack of Transportation (Non-Medical): Not on file  Physical Activity:   . Days of Exercise per Week: Not on file  . Minutes of Exercise per Session: Not on file  Stress:   . Feeling of Stress : Not on file  Social Connections:   . Frequency of Communication with Friends and Family: Not on file  . Frequency of Social Gatherings with Friends and Family: Not on file  . Attends Religious Services: Not on file  . Active Member of Clubs or Organizations: Not on file  . Attends Archivist Meetings: Not on file  . Marital Status: Not on file  Intimate Partner Violence:   . Fear of Current or Ex-Partner: Not on file  .  Emotionally Abused: Not on file  . Physically Abused: Not on file  . Sexually Abused: Not on file    Environmental History: Patient lives in an apartment with carpeting throughout and central air/heat.  There is no known mold/water damage in the home.  There are no pets in the home.  She is a non-smoker.  Current Outpatient Medications  Medication Sig Dispense Refill  . ibuprofen (ADVIL) 800 MG tablet Take 1 tablet (800 mg total) by mouth 3 (three) times daily. 30 tablet 0  . Prenatal Vit w/Fe-Methylfol-FA (PNV PO) Take 1 tablet by mouth daily.     . famotidine (PEPCID) 20 MG tablet Take 1 tablet (20 mg total) by mouth 2 (two) times daily as needed for heartburn or indigestion. 60 tablet 5  .  levocetirizine (XYZAL) 5 MG tablet Take 1 tablet (5 mg total) by mouth daily as needed. 30 tablet 5   No current facility-administered medications for this visit.    Known medication allergies: No Known Allergies  I appreciate the opportunity to take part in [facility] care. Please do not hesitate to contact me with questions.  Sincerely,   R. Edgar Frisk, MD

## 2020-02-18 NOTE — Assessment & Plan Note (Signed)
Unclear etiology.  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  NSAIDs and emotional stress commonly exacerbate urticaria but are not the underlying etiology in this case. Physical urticarias are negative by history (i.e. pressure-induced, temperature, vibration, solar, etc.). History and lesions are not consistent with urticaria pigmentosa so I am not suspicious for mastocytosis. There are no concomitant symptoms concerning for anaphylaxis or constitutional symptoms worrisome for an underlying malignancy. We will rule out other potential etiologies with labs. For symptom relief, patient is to take oral antihistamines as directed.  The following labs have been ordered: FCeRI antibody, anti-thyroglobulin antibody, thyroid peroxidase antibody, tryptase, CBC, CMP, ESR, ANA, and alpha-gal panel.  The patient will be called with further recommendations after lab results have returned.  Instructions have been discussed and provided for H1/H2 receptor blockade with titration to find lowest effective dose.  A prescription has been provided for levocetirizine (Xyzal), 5 mg daily as needed.  A prescription has been provided for famotidine (Pepcid), 18m twice daily as needed.  Should there be a significant increase or change in symptoms, a journal is to be kept recording any foods eaten, beverages consumed, medications taken within a 6 hour period prior to the onset of symptoms, as well as record activities being performed, and environmental conditions. For any symptoms concerning for anaphylaxis, 911 is to be called immediately.

## 2020-02-18 NOTE — Patient Instructions (Addendum)
Recurrent urticaria Unclear etiology.  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  NSAIDs and emotional stress commonly exacerbate urticaria but are not the underlying etiology in this case. Physical urticarias are negative by history (i.e. pressure-induced, temperature, vibration, solar, etc.). History and lesions are not consistent with urticaria pigmentosa so I am not suspicious for mastocytosis. There are no concomitant symptoms concerning for anaphylaxis or constitutional symptoms worrisome for an underlying malignancy. We will rule out other potential etiologies with labs. For symptom relief, patient is to take oral antihistamines as directed.  The following labs have been ordered: FCeRI antibody, anti-thyroglobulin antibody, thyroid peroxidase antibody, tryptase, CBC, CMP, ESR, ANA, and alpha-gal panel.  The patient will be called with further recommendations after lab results have returned.  Instructions have been discussed and provided for H1/H2 receptor blockade with titration to find lowest effective dose.  A prescription has been provided for levocetirizine (Xyzal), 5 mg daily as needed.  A prescription has been provided for famotidine (Pepcid), 64m twice daily as needed.  Should there be a significant increase or change in symptoms, a journal is to be kept recording any foods eaten, beverages consumed, medications taken within a 6 hour period prior to the onset of symptoms, as well as record activities being performed, and environmental conditions. For any symptoms concerning for anaphylaxis, 911 is to be called immediately.  Angioedema Associated angioedema occurs in up to 50% of patients with chronic urticaria.  Treatment/diagnostic plan as outlined above.   When lab results have returned you will be called with further recommendations. With the newly implemented Cures Act, the labs may be visible to you at the same time they become visible to [place] However, the  results will typically not be addressed until all of the results are back, so please be patient.  Until you have heard from [place] please continue the treatment plan as outlined on your take home sheet.  Urticaria (Hives)  . Levocetirizine (Xyzal) 5 mg twice a day and famotidine (Pepcid) 20 mg twice a day. If no symptoms for 7-14 days then decrease to. . Levocetirizine (Xyzal) 5 mg twice a day and famotidine (Pepcid) 20 mg once a day.  If no symptoms for 7-14 days then decrease to. . Levocetirizine (Xyzal) 5 mg twice a day.  If no symptoms for 7-14 days then decrease to. . Levocetirizine (Xyzal) 5 mg once a day.  May use Benadryl (diphenhydramine) as needed for breakthrough symptoms       If symptoms return, then step up dosage

## 2020-02-18 NOTE — Assessment & Plan Note (Signed)
Associated angioedema occurs in up to 50% of patients with chronic urticaria.  Treatment/diagnostic plan as outlined above. 

## 2020-03-15 LAB — CHRONIC URTICARIA

## 2020-03-17 LAB — ALPHA-GAL PANEL
Alpha Gal IgE*: <0.1 kU/L
Beef (Bos spp) IgE: <0.1 kU/L
Class Interpretation: 0
Class Interpretation: 0
Class Interpretation: 0
Lamb/Mutton (Ovis spp) IgE: <0.1 kU/L
Pork (Sus spp) IgE: <0.1 kU/L

## 2020-03-19 LAB — CBC WITH DIFFERENTIAL/PLATELET
Basophils Absolute: 0 10*3/uL (ref 0.0–0.2)
Basos: 0 %
EOS (ABSOLUTE): 0.1 10*3/uL (ref 0.0–0.4)
Eos: 2 %
Hematocrit: 37.2 % (ref 34.0–46.6)
Hemoglobin: 12.6 g/dL (ref 11.1–15.9)
Immature Grans (Abs): 0 10*3/uL (ref 0.0–0.1)
Immature Granulocytes: 0 %
Lymphocytes Absolute: 2.5 10*3/uL (ref 0.7–3.1)
Lymphs: 47 %
MCH: 28.3 pg (ref 26.6–33.0)
MCHC: 33.9 g/dL (ref 31.5–35.7)
MCV: 84 fL (ref 79–97)
Monocytes Absolute: 0.3 10*3/uL (ref 0.1–0.9)
Monocytes: 6 %
Neutrophils Absolute: 2.4 10*3/uL (ref 1.4–7.0)
Neutrophils: 45 %
Platelets: 224 10*3/uL (ref 150–450)
RBC: 4.45 x10E6/uL (ref 3.77–5.28)
RDW: 11.6 % — ABNORMAL LOW (ref 11.7–15.4)
WBC: 5.3 10*3/uL (ref 3.4–10.8)

## 2020-03-19 LAB — COMPREHENSIVE METABOLIC PANEL
ALT: 8 IU/L (ref 0–32)
AST: 17 IU/L (ref 0–40)
Albumin/Globulin Ratio: 1.6 (ref 1.2–2.2)
Albumin: 4.4 g/dL (ref 3.9–5.0)
Alkaline Phosphatase: 85 IU/L (ref 39–117)
BUN/Creatinine Ratio: 15 (ref 9–23)
BUN: 9 mg/dL (ref 6–20)
Bilirubin Total: 0.6 mg/dL (ref 0.0–1.2)
CO2: 18 mmol/L — ABNORMAL LOW (ref 20–29)
Calcium: 9.2 mg/dL (ref 8.7–10.2)
Chloride: 106 mmol/L (ref 96–106)
Creatinine, Ser: 0.6 mg/dL (ref 0.57–1.00)
GFR calc Af Amer: 143 mL/min/{1.73_m2} (ref >59)
GFR calc non Af Amer: 124 mL/min/{1.73_m2} (ref >59)
Globulin, Total: 2.7 g/dL (ref 1.5–4.5)
Glucose: 87 mg/dL (ref 65–99)
Potassium: 4 mmol/L (ref 3.5–5.2)
Sodium: 143 mmol/L (ref 134–144)
Total Protein: 7.1 g/dL (ref 6.0–8.5)

## 2020-03-19 LAB — THYROID PEROXIDASE ANTIBODY: Thyroperoxidase Ab SerPl-aCnc: <9 IU/mL (ref 0–34)

## 2020-03-19 LAB — SEDIMENTATION RATE: Sed Rate: 3 mm/hr (ref 0–32)

## 2020-03-19 LAB — ANA W/REFLEX IF POSITIVE: Anti Nuclear Antibody (ANA): NEGATIVE

## 2020-03-19 LAB — TRYPTASE: Tryptase: 3.3 ug/L (ref 2.2–13.2)

## 2020-03-19 LAB — THYROGLOBULIN LEVEL: Thyroglobulin (TG-RIA): 12 ng/mL

## 2020-07-08 LAB — URINE CULTURE
Drug Screen, Urine: NEGATIVE
Pap: NEGATIVE
Urine Culture, OB: NO GROWTH

## 2020-07-08 LAB — OB RESULTS CONSOLE HGB/HCT, BLOOD
HCT: 35 (ref 29–41)
Hemoglobin: 11.5

## 2020-07-08 LAB — OB RESULTS CONSOLE ABO/RH: RH Type: POSITIVE

## 2020-07-08 LAB — OB RESULTS CONSOLE GC/CHLAMYDIA
Chlamydia: NEGATIVE
Gonorrhea: NEGATIVE

## 2020-07-08 LAB — OB RESULTS CONSOLE ANTIBODY SCREEN: Antibody Screen: NEGATIVE

## 2020-07-08 LAB — OB RESULTS CONSOLE RPR: RPR: NONREACTIVE

## 2020-07-08 LAB — OB RESULTS CONSOLE RUBELLA ANTIBODY, IGM: Rubella: IMMUNE

## 2020-07-08 LAB — OB RESULTS CONSOLE HEPATITIS B SURFACE ANTIGEN: Hepatitis B Surface Ag: NEGATIVE

## 2020-07-08 LAB — OB RESULTS CONSOLE PLATELET COUNT: Platelets: 162

## 2020-07-08 LAB — OB RESULTS CONSOLE HIV ANTIBODY (ROUTINE TESTING): HIV: NONREACTIVE

## 2020-08-31 ENCOUNTER — Other Ambulatory Visit: Payer: Self-pay | Admitting: Obstetrics and Gynecology

## 2020-08-31 DIAGNOSIS — Z363 Encounter for antenatal screening for malformations: Secondary | ICD-10-CM

## 2020-09-28 ENCOUNTER — Encounter: Payer: Self-pay | Admitting: *Deleted

## 2020-09-29 ENCOUNTER — Ambulatory Visit: Payer: Medicaid Other | Admitting: *Deleted

## 2020-09-29 ENCOUNTER — Ambulatory Visit: Payer: Medicaid Other | Attending: Obstetrics and Gynecology

## 2020-09-29 ENCOUNTER — Encounter: Payer: Self-pay | Admitting: *Deleted

## 2020-09-29 ENCOUNTER — Other Ambulatory Visit: Payer: Self-pay

## 2020-09-29 ENCOUNTER — Other Ambulatory Visit: Payer: Self-pay | Admitting: Obstetrics and Gynecology

## 2020-09-29 VITALS — BP 104/62 | HR 92

## 2020-09-29 DIAGNOSIS — O365920 Maternal care for other known or suspected poor fetal growth, second trimester, not applicable or unspecified: Secondary | ICD-10-CM | POA: Insufficient documentation

## 2020-09-29 DIAGNOSIS — Z363 Encounter for antenatal screening for malformations: Secondary | ICD-10-CM | POA: Diagnosis not present

## 2020-09-30 ENCOUNTER — Other Ambulatory Visit: Payer: Self-pay | Admitting: *Deleted

## 2020-09-30 DIAGNOSIS — O365990 Maternal care for other known or suspected poor fetal growth, unspecified trimester, not applicable or unspecified: Secondary | ICD-10-CM

## 2020-10-04 ENCOUNTER — Encounter: Payer: Self-pay | Admitting: *Deleted

## 2020-10-04 ENCOUNTER — Encounter: Payer: Self-pay | Admitting: Obstetrics and Gynecology

## 2020-10-04 DIAGNOSIS — O365990 Maternal care for other known or suspected poor fetal growth, unspecified trimester, not applicable or unspecified: Secondary | ICD-10-CM | POA: Insufficient documentation

## 2020-10-07 ENCOUNTER — Encounter: Payer: Medicaid Other | Admitting: Obstetrics & Gynecology

## 2020-10-08 ENCOUNTER — Ambulatory Visit: Payer: Medicaid Other | Admitting: *Deleted

## 2020-10-08 ENCOUNTER — Other Ambulatory Visit: Payer: Self-pay

## 2020-10-08 ENCOUNTER — Ambulatory Visit: Payer: Medicaid Other | Attending: Obstetrics and Gynecology

## 2020-10-08 VITALS — BP 100/52 | HR 96

## 2020-10-08 DIAGNOSIS — O365990 Maternal care for other known or suspected poor fetal growth, unspecified trimester, not applicable or unspecified: Secondary | ICD-10-CM

## 2020-10-08 DIAGNOSIS — Z3A27 27 weeks gestation of pregnancy: Secondary | ICD-10-CM

## 2020-10-08 DIAGNOSIS — O365920 Maternal care for other known or suspected poor fetal growth, second trimester, not applicable or unspecified: Secondary | ICD-10-CM

## 2020-10-08 DIAGNOSIS — Z363 Encounter for antenatal screening for malformations: Secondary | ICD-10-CM | POA: Diagnosis not present

## 2020-10-08 NOTE — Procedures (Signed)
Lisa Heath 10/26/1991 [redacted]w[redacted]d  Fetus A Non-Stress Test Interpretation for 10/08/20  Indication: IUGR  Fetal Heart Rate A Mode: External Baseline Rate (A): 140 bpm Variability: Moderate Accelerations: 10 x 10, 15 x 15 Decelerations: None Multiple birth?: No  Uterine Activity Mode: Palpation, Toco Contraction Frequency (min): none noted Resting Tone Palpated: Relaxed Resting Time: Adequate  Interpretation (Fetal Testing) Nonstress Test Interpretation: Reactive Comments: Reviewed tracing with Dr. Parke Poisson

## 2020-10-15 ENCOUNTER — Ambulatory Visit: Payer: Medicaid Other

## 2020-10-15 ENCOUNTER — Other Ambulatory Visit: Payer: Medicaid Other

## 2020-10-20 ENCOUNTER — Encounter: Payer: Self-pay | Admitting: Obstetrics and Gynecology

## 2020-10-20 ENCOUNTER — Ambulatory Visit (INDEPENDENT_AMBULATORY_CARE_PROVIDER_SITE_OTHER): Payer: Medicaid Other | Admitting: Obstetrics and Gynecology

## 2020-10-20 ENCOUNTER — Other Ambulatory Visit: Payer: Self-pay

## 2020-10-20 VITALS — BP 97/68 | HR 104 | Wt 128.0 lb

## 2020-10-20 DIAGNOSIS — O0990 Supervision of high risk pregnancy, unspecified, unspecified trimester: Secondary | ICD-10-CM | POA: Insufficient documentation

## 2020-10-20 DIAGNOSIS — O365930 Maternal care for other known or suspected poor fetal growth, third trimester, not applicable or unspecified: Secondary | ICD-10-CM

## 2020-10-20 DIAGNOSIS — Z8759 Personal history of other complications of pregnancy, childbirth and the puerperium: Secondary | ICD-10-CM

## 2020-10-20 DIAGNOSIS — Z789 Other specified health status: Secondary | ICD-10-CM

## 2020-10-20 MED ORDER — BLOOD PRESSURE KIT DEVI: 1.0000 | Freq: Every day | 0 refills | Status: DC

## 2020-10-20 NOTE — Progress Notes (Signed)
Prenatal Visit Note Date: 10/20/2020 Clinic: [facility]  Transfer of care visit from [facility] due to [facility]  Subjective:  Lisa Heath is a 29 y.o. G2P1001 at [redacted]w[redacted]d being seen today for ongoing prenatal care.  She is currently monitored for the following issues for this high-risk pregnancy and has Language barrier affecting health care; History of postpartum hemorrhage; Recurrent urticaria; IUGR (intrauterine growth restriction) affecting care of mother; and Supervision of high risk pregnancy, antepartum on their problem list.  Patient reports no complaints.   Contractions: Not present. Vag. Bleeding: None.  Movement: Present. Denies leaking of fluid.   The following portions of the patient's history were reviewed and updated as appropriate: allergies, current medications, past family history, past medical history, past social history, past surgical history and problem list. Problem list updated.  Objective:   Vitals:   10/20/20 1414  BP: 97/68  Pulse: (!) 104  Weight: 128 lb (58.1 kg)    Fetal Status: Fetal Heart Rate (bpm): 156   Movement: Present     General:  Alert, oriented and cooperative. Patient is in no acute distress.  Skin: Skin is warm and dry. No rash noted.   Cardiovascular: Normal heart rate noted  Respiratory: Normal respiratory effort, no problems with respiration noted  Abdomen: Soft, gravid, appropriate for gestational age. Pain/Pressure: Absent     Pelvic:  Cervical exam deferred        Extremities: Normal range of motion.  Edema: None  Mental Status: Normal mood and affect. Normal behavior. Normal judgment and thought content.   Urinalysis:      Assessment and Plan:  Pregnancy: G2P1001 at [redacted]w[redacted]d  1. Supervision of high risk pregnancy, antepartum Routine care. 28wk labs nv - Glucose Tolerance, 2 Hours w/1 Hour; Future - CBC; Future - RPR; Future - HIV antibody (with reflex); Future  2. Poor fetal growth affecting management of  mother in third trimester, single or unspecified fetus 10/20: efw 9%, 744gm, ac 22% with normal ua dopplers and on 10/29; has rpt dopplers on 11/12.  D/w her management based on surveillance u/s with MFM. Weight gain good at 24lbs thus far this pregnancy. Problem list updated.    3. Language barrier affecting health care Interpreter used  4. History of postpartum hemorrhage Likely due to atony. Problem list updated  Preterm labor symptoms and general obstetric precautions including but not limited to vaginal bleeding, contractions, leaking of fluid and fetal movement were reviewed in detail with the patient. Please refer to After Visit Summary for other counseling recommendations.  Return in about 2 days (around 10/22/2020) for 2hr GTT.   Wilson Bing, MD

## 2020-10-22 ENCOUNTER — Other Ambulatory Visit: Payer: Medicaid Other

## 2020-10-22 ENCOUNTER — Ambulatory Visit: Payer: Medicaid Other | Attending: Obstetrics and Gynecology

## 2020-10-22 ENCOUNTER — Other Ambulatory Visit: Payer: Self-pay

## 2020-10-22 ENCOUNTER — Ambulatory Visit: Payer: Medicaid Other

## 2020-10-22 DIAGNOSIS — O0990 Supervision of high risk pregnancy, unspecified, unspecified trimester: Secondary | ICD-10-CM

## 2020-10-23 ENCOUNTER — Encounter: Payer: Self-pay | Admitting: Obstetrics and Gynecology

## 2020-10-23 DIAGNOSIS — D696 Thrombocytopenia, unspecified: Secondary | ICD-10-CM | POA: Insufficient documentation

## 2020-10-23 LAB — CBC
Hematocrit: 33.5 % — ABNORMAL LOW (ref 34.0–46.6)
Hemoglobin: 11.5 g/dL (ref 11.1–15.9)
MCH: 29 pg (ref 26.6–33.0)
MCHC: 34.3 g/dL (ref 31.5–35.7)
MCV: 85 fL (ref 79–97)
Platelets: 133 10*3/uL — ABNORMAL LOW (ref 150–450)
RBC: 3.96 x10E6/uL (ref 3.77–5.28)
RDW: 12.6 % (ref 11.7–15.4)
WBC: 6.3 10*3/uL (ref 3.4–10.8)

## 2020-10-23 LAB — HIV ANTIBODY (ROUTINE TESTING W REFLEX): HIV Screen 4th Generation wRfx: NONREACTIVE

## 2020-10-23 LAB — RPR: RPR Ser Ql: NONREACTIVE

## 2020-10-23 LAB — GLUCOSE TOLERANCE, 2 HOURS W/ 1HR
Glucose, 1 hour: 82 mg/dL (ref 65–179)
Glucose, 2 hour: 62 mg/dL — ABNORMAL LOW (ref 65–152)
Glucose, Fasting: 76 mg/dL (ref 65–91)

## 2020-10-29 ENCOUNTER — Ambulatory Visit: Payer: Medicaid Other | Admitting: *Deleted

## 2020-10-29 ENCOUNTER — Ambulatory Visit: Payer: Medicaid Other | Attending: Obstetrics and Gynecology

## 2020-10-29 ENCOUNTER — Other Ambulatory Visit: Payer: Self-pay

## 2020-10-29 ENCOUNTER — Ambulatory Visit (HOSPITAL_BASED_OUTPATIENT_CLINIC_OR_DEPARTMENT_OTHER): Payer: Medicaid Other | Admitting: *Deleted

## 2020-10-29 ENCOUNTER — Encounter: Payer: Self-pay | Admitting: *Deleted

## 2020-10-29 ENCOUNTER — Other Ambulatory Visit: Payer: Self-pay | Admitting: *Deleted

## 2020-10-29 DIAGNOSIS — Z3A30 30 weeks gestation of pregnancy: Secondary | ICD-10-CM | POA: Diagnosis present

## 2020-10-29 DIAGNOSIS — Z362 Encounter for other antenatal screening follow-up: Secondary | ICD-10-CM | POA: Diagnosis not present

## 2020-10-29 DIAGNOSIS — O365990 Maternal care for other known or suspected poor fetal growth, unspecified trimester, not applicable or unspecified: Secondary | ICD-10-CM | POA: Insufficient documentation

## 2020-10-29 DIAGNOSIS — O365930 Maternal care for other known or suspected poor fetal growth, third trimester, not applicable or unspecified: Secondary | ICD-10-CM

## 2020-10-29 DIAGNOSIS — O0990 Supervision of high risk pregnancy, unspecified, unspecified trimester: Secondary | ICD-10-CM | POA: Diagnosis present

## 2020-10-29 NOTE — Procedures (Signed)
Lisa Heath 09-06-1991 [redacted]w[redacted]d  Fetus A Non-Stress Test Interpretation for 10/29/20  Indication: IUGR  Fetal Heart Rate A Mode: External Baseline Rate (A): 140 bpm Variability: Moderate Accelerations: 10 x 10 Decelerations: None Multiple birth?: No  Uterine Activity Mode: Palpation, Toco Contraction Frequency (min): none noted Resting Tone Palpated: Relaxed Resting Time: Adequate  Interpretation (Fetal Testing) Nonstress Test Interpretation: Reactive Overall Impression: Reassuring for gestational age Comments: Reviewed with Dr. Parke Poisson

## 2020-11-03 ENCOUNTER — Encounter: Payer: Medicaid Other | Admitting: Family Medicine

## 2020-11-19 ENCOUNTER — Ambulatory Visit: Payer: Medicaid Other | Admitting: *Deleted

## 2020-11-19 ENCOUNTER — Encounter: Payer: Self-pay | Admitting: *Deleted

## 2020-11-19 ENCOUNTER — Ambulatory Visit: Payer: Medicaid Other | Attending: Obstetrics and Gynecology

## 2020-11-19 ENCOUNTER — Other Ambulatory Visit: Payer: Self-pay

## 2020-11-19 DIAGNOSIS — O0990 Supervision of high risk pregnancy, unspecified, unspecified trimester: Secondary | ICD-10-CM

## 2020-11-19 DIAGNOSIS — Z362 Encounter for other antenatal screening follow-up: Secondary | ICD-10-CM | POA: Insufficient documentation

## 2020-11-19 DIAGNOSIS — O365930 Maternal care for other known or suspected poor fetal growth, third trimester, not applicable or unspecified: Secondary | ICD-10-CM | POA: Diagnosis not present

## 2020-11-19 DIAGNOSIS — Z3A33 33 weeks gestation of pregnancy: Secondary | ICD-10-CM | POA: Diagnosis not present

## 2020-11-29 ENCOUNTER — Ambulatory Visit (INDEPENDENT_AMBULATORY_CARE_PROVIDER_SITE_OTHER): Payer: Medicaid Other | Admitting: Obstetrics & Gynecology

## 2020-11-29 ENCOUNTER — Other Ambulatory Visit: Payer: Self-pay

## 2020-11-29 VITALS — Wt 136.3 lb

## 2020-11-29 DIAGNOSIS — Z23 Encounter for immunization: Secondary | ICD-10-CM | POA: Diagnosis not present

## 2020-11-29 DIAGNOSIS — D696 Thrombocytopenia, unspecified: Secondary | ICD-10-CM

## 2020-11-29 DIAGNOSIS — O0990 Supervision of high risk pregnancy, unspecified, unspecified trimester: Secondary | ICD-10-CM | POA: Diagnosis not present

## 2020-11-29 DIAGNOSIS — O99113 Other diseases of the blood and blood-forming organs and certain disorders involving the immune mechanism complicating pregnancy, third trimester: Secondary | ICD-10-CM

## 2020-11-29 MED ORDER — PRENATAL PLUS 27-1 MG PO TABS: 1.0000 | ORAL_TABLET | Freq: Every day | ORAL | 6 refills | Status: DC

## 2020-11-29 NOTE — Progress Notes (Signed)
   PRENATAL VISIT NOTE  Subjective:  Lisa Heath is a 29 y.o. G2P1001 at 104w4d being seen today for ongoing prenatal care.  She is currently monitored for the following issues for this high-risk pregnancy and has Language barrier affecting health care; History of postpartum hemorrhage; Recurrent urticaria; IUGR (intrauterine growth restriction) affecting care of mother; Supervision of high risk pregnancy, antepartum; and Gestational thrombocytopenia (HCC) on their problem list.  Patient reports no bleeding, no contractions, no cramping and no leaking.   .  .   . Denies leaking of fluid.   The following portions of the patient's history were reviewed and updated as appropriate: allergies, current medications, past family history, past medical history, past social history, past surgical history and problem list.   Objective:   Vitals:   11/29/20 1405  Weight: 136 lb 4.8 oz (61.8 kg)    Fetal Status: FHT 140 bpm   General:  Alert, oriented and cooperative. Patient is in no acute distress.  Skin: Skin is warm and dry. No rash noted.   Cardiovascular: Normal heart rate noted  Respiratory: Normal respiratory effort, no problems with respiration noted  Abdomen: Soft, gravid, appropriate for gestational age.        Pelvic: Cervical exam deferred        Extremities: Normal range of motion.     Mental Status: Normal mood and affect. Normal behavior. Normal judgment and thought content.   Assessment and Plan:  Pregnancy: G2P1001 at [redacted]w[redacted]d 1. Supervision of high risk pregnancy, antepartum - Cultures next visit - Tdap vaccine greater than or equal to 7yo IM - CBC  2. Benign gestational thrombocytopenia in third trimester (HCC) CBC today  Preterm labor symptoms and general obstetric precautions including but not limited to vaginal bleeding, contractions, leaking of fluid and fetal movement were reviewed in detail with the patient. Please refer to After Visit Summary for other counseling  recommendations.   Return in 2 weeks (on 12/13/2020).  No future appointments.  Malachy Chamber, MD

## 2020-11-29 NOTE — Addendum Note (Signed)
Addended by: Marjo Bicker on: 11/29/2020 05:24 PM   Modules accepted: Orders

## 2020-11-30 LAB — CBC
Hematocrit: 31.7 % — ABNORMAL LOW (ref 34.0–46.6)
Hemoglobin: 10.9 g/dL — ABNORMAL LOW (ref 11.1–15.9)
MCH: 29.3 pg (ref 26.6–33.0)
MCHC: 34.4 g/dL (ref 31.5–35.7)
MCV: 85 fL (ref 79–97)
Platelets: 124 10*3/uL — ABNORMAL LOW (ref 150–450)
RBC: 3.72 x10E6/uL — ABNORMAL LOW (ref 3.77–5.28)
RDW: 12.8 % (ref 11.7–15.4)
WBC: 8.1 10*3/uL (ref 3.4–10.8)

## 2020-12-11 NOTE — L&D Delivery Note (Signed)
Delivery Note Progressed quickly to complete dilation and pushed effectively to delivery.   At 12:08 AM a viable and healthy female was delivered via Vaginal, Spontaneous (Presentation: Left Occiput Anterior).  APGAR: 8, 9; weight pending .   Placenta status: Spontaneous, Intact.  Cord: 3 vessels with the following complications:  None.    Anesthesia: Epidural Episiotomy: None Lacerations:  1st degree midline perineal Suture Repair: 3.0 vicryl rapide Est. Blood Loss (mL):   Mom to postpartum.  Baby to Couplet care / Skin to Skin.  Wynelle Bourgeois 01/12/2021, 12:47 AM   Please schedule this patient for Postpartum visit in: 4 weeks with the following provider: Any provider virtual or inperson For C/S patients schedule nurse incision check in weeks 2 weeks: no High risk pregnancy complicated by: IUGR  (but baby was actually normal size) Delivery mode:  SVD Anticipated Birth Control:  OCPs PP Procedures needed: none  Edinburgh: negative Schedule Integrated BH visit: no  No relevant baby issues

## 2020-12-16 ENCOUNTER — Other Ambulatory Visit: Payer: Self-pay

## 2020-12-16 ENCOUNTER — Ambulatory Visit (INDEPENDENT_AMBULATORY_CARE_PROVIDER_SITE_OTHER): Payer: Medicaid Other | Admitting: Obstetrics & Gynecology

## 2020-12-16 ENCOUNTER — Other Ambulatory Visit (HOSPITAL_COMMUNITY)
Admission: RE | Admit: 2020-12-16 | Discharge: 2020-12-16 | Disposition: A | Discharge status: Home / Self Care | Payer: Medicaid Other | Source: Ambulatory Visit | Attending: Obstetrics & Gynecology | Admitting: Obstetrics & Gynecology

## 2020-12-16 VITALS — BP 105/73 | HR 98 | Wt 136.6 lb

## 2020-12-16 DIAGNOSIS — D696 Thrombocytopenia, unspecified: Secondary | ICD-10-CM

## 2020-12-16 DIAGNOSIS — O99113 Other diseases of the blood and blood-forming organs and certain disorders involving the immune mechanism complicating pregnancy, third trimester: Secondary | ICD-10-CM

## 2020-12-16 DIAGNOSIS — O0990 Supervision of high risk pregnancy, unspecified, unspecified trimester: Secondary | ICD-10-CM

## 2020-12-16 DIAGNOSIS — Z789 Other specified health status: Secondary | ICD-10-CM

## 2020-12-16 NOTE — Patient Instructions (Signed)

## 2020-12-16 NOTE — Progress Notes (Signed)
Interpreter via [employer] #[id number]

## 2020-12-17 LAB — GC/CHLAMYDIA PROBE AMP (~~LOC~~) NOT AT ARMC
Chlamydia: NEGATIVE
Comment: NEGATIVE
Comment: NORMAL
Neisseria Gonorrhea: NEGATIVE

## 2020-12-20 LAB — CULTURE, BETA STREP (GROUP B ONLY): Strep Gp B Culture: NEGATIVE

## 2020-12-23 ENCOUNTER — Ambulatory Visit (INDEPENDENT_AMBULATORY_CARE_PROVIDER_SITE_OTHER): Payer: Medicaid Other | Admitting: Obstetrics & Gynecology

## 2020-12-23 ENCOUNTER — Other Ambulatory Visit: Payer: Self-pay

## 2020-12-23 VITALS — BP 99/59 | HR 89 | Wt 137.3 lb

## 2020-12-23 DIAGNOSIS — Z8759 Personal history of other complications of pregnancy, childbirth and the puerperium: Secondary | ICD-10-CM

## 2020-12-23 DIAGNOSIS — Z789 Other specified health status: Secondary | ICD-10-CM

## 2020-12-23 DIAGNOSIS — O0990 Supervision of high risk pregnancy, unspecified, unspecified trimester: Secondary | ICD-10-CM

## 2020-12-23 NOTE — Progress Notes (Unsigned)
[facility] interpreter Kalman Shan [phone]

## 2020-12-23 NOTE — Patient Instructions (Signed)

## 2020-12-29 ENCOUNTER — Other Ambulatory Visit: Payer: Self-pay

## 2020-12-29 ENCOUNTER — Encounter: Payer: Self-pay | Admitting: Student

## 2020-12-29 ENCOUNTER — Ambulatory Visit (INDEPENDENT_AMBULATORY_CARE_PROVIDER_SITE_OTHER): Payer: Medicaid Other | Admitting: Student

## 2020-12-29 VITALS — BP 93/62 | HR 81 | Wt 139.8 lb

## 2020-12-29 DIAGNOSIS — O0990 Supervision of high risk pregnancy, unspecified, unspecified trimester: Secondary | ICD-10-CM

## 2020-12-29 DIAGNOSIS — D696 Thrombocytopenia, unspecified: Secondary | ICD-10-CM

## 2020-12-29 DIAGNOSIS — Z3A38 38 weeks gestation of pregnancy: Secondary | ICD-10-CM

## 2020-12-29 DIAGNOSIS — O99113 Other diseases of the blood and blood-forming organs and certain disorders involving the immune mechanism complicating pregnancy, third trimester: Secondary | ICD-10-CM

## 2020-12-29 NOTE — Progress Notes (Addendum)
   PRENATAL VISIT NOTE  Subjective:  Lisa Heath is a 30 y.o. G2P1001 at [redacted]w[redacted]d being seen today for ongoing prenatal care.  She is currently monitored for the following issues for this high-risk pregnancy and has Language barrier affecting health care; History of postpartum hemorrhage; Recurrent urticaria; Supervision of high risk pregnancy, antepartum; and Gestational thrombocytopenia (HCC) on their problem list.  Patient reports no complaints.  Contractions: Not present. Vag. Bleeding: None.  Movement: Present. Denies leaking of fluid.   The following portions of the patient's history were reviewed and updated as appropriate: allergies, current medications, past family history, past medical history, past social history, past surgical history and problem list.   Objective:   Vitals:   12/29/20 1625  BP: 93/62  Pulse: 81  Weight: 139 lb 12.8 oz (63.4 kg)    Fetal Status: Fetal Heart Rate (bpm): 140 Fundal Height: 35 cm Movement: Present     General:  Alert, oriented and cooperative. Patient is in no acute distress.  Skin: Skin is warm and dry. No rash noted.   Cardiovascular: Normal heart rate noted  Respiratory: Normal respiratory effort, no problems with respiration noted  Abdomen: Soft, gravid, appropriate for gestational age.  Pain/Pressure: Present     Pelvic: Cervical exam performed in the presence of a chaperone Dilation: Fingertip      Extremities: Normal range of motion.  Edema: None  Mental Status: Normal mood and affect. Normal behavior. Normal judgment and thought content.   Assessment and Plan:  Pregnancy: G2P1001 at [redacted]w[redacted]d  1. [redacted] weeks gestation of pregnancy   2. Supervision of high risk pregnancy, antepartum   3. Benign gestational thrombocytopenia in third trimester (HCC)    -patient's FH measuring 35 weeks, needs [place] to check for fetal growth as patient had FGR earlier in pregnancy., She reports strong fetal movements.  -cervix is fingertip today; long,  posterior.  -platelets reviewed: 124  Term labor symptoms and general obstetric precautions including but not limited to vaginal bleeding, contractions, leaking of fluid and fetal movement were reviewed in detail with the patient. Please refer to After Visit Summary for other counseling recommendations.   No follow-ups on file.  Future Appointments  Date Time Provider Department Center  01/05/2021  2:30 PM [facility] US3 [facility]  01/06/2021  1:35 PM Jerene Bears, MD [facility]    Marylene Land, [place]

## 2021-01-05 ENCOUNTER — Other Ambulatory Visit: Payer: Self-pay

## 2021-01-05 ENCOUNTER — Ambulatory Visit: Payer: Medicaid Other | Admitting: *Deleted

## 2021-01-05 ENCOUNTER — Encounter: Payer: Self-pay | Admitting: *Deleted

## 2021-01-05 ENCOUNTER — Ambulatory Visit: Payer: Medicaid Other | Attending: Student

## 2021-01-05 DIAGNOSIS — O0993 Supervision of high risk pregnancy, unspecified, third trimester: Secondary | ICD-10-CM | POA: Diagnosis not present

## 2021-01-05 DIAGNOSIS — O26843 Uterine size-date discrepancy, third trimester: Secondary | ICD-10-CM | POA: Insufficient documentation

## 2021-01-05 DIAGNOSIS — Z3A39 39 weeks gestation of pregnancy: Secondary | ICD-10-CM | POA: Insufficient documentation

## 2021-01-05 DIAGNOSIS — O0990 Supervision of high risk pregnancy, unspecified, unspecified trimester: Secondary | ICD-10-CM

## 2021-01-05 DIAGNOSIS — Z3A38 38 weeks gestation of pregnancy: Secondary | ICD-10-CM | POA: Diagnosis not present

## 2021-01-06 ENCOUNTER — Telehealth (INDEPENDENT_AMBULATORY_CARE_PROVIDER_SITE_OTHER): Payer: Medicaid Other | Admitting: Obstetrics & Gynecology

## 2021-01-06 VITALS — BP 115/65

## 2021-01-06 DIAGNOSIS — Z789 Other specified health status: Secondary | ICD-10-CM | POA: Diagnosis not present

## 2021-01-06 DIAGNOSIS — D696 Thrombocytopenia, unspecified: Secondary | ICD-10-CM

## 2021-01-06 DIAGNOSIS — O99113 Other diseases of the blood and blood-forming organs and certain disorders involving the immune mechanism complicating pregnancy, third trimester: Secondary | ICD-10-CM

## 2021-01-06 DIAGNOSIS — O0990 Supervision of high risk pregnancy, unspecified, unspecified trimester: Secondary | ICD-10-CM

## 2021-01-06 DIAGNOSIS — Z3A40 40 weeks gestation of pregnancy: Secondary | ICD-10-CM

## 2021-01-06 NOTE — Progress Notes (Signed)
I connected with  [name] on 01/06/21 at 1333 by telephone with [facility] interpreter [place] ID [phone] and verified that I am speaking with the correct person using two identifiers. Patient set MyChart link; connected with patient via MyChart, [facility] interpreter ID [id number] interpreted the remainder of this encounter.   I discussed the limitations, risks, security and privacy concerns of performing an evaluation and management service by telephone and the availability of in person appointments. I also discussed with the patient that there may be a patient responsible charge related to this service. The patient expressed understanding and agreed to proceed.  Marjo Bicker, RN 01/06/2021  1:32 PM

## 2021-01-06 NOTE — Progress Notes (Signed)
I connected with Lisa Heath 01/07/21 at  1:35 PM EST by: MyChart video and verified that I am speaking with the correct person using two identifiers.  Patient is located at home and provider is located at [facility].     The purpose of this virtual visit is to provide medical care while limiting exposure to the novel coronavirus. I discussed the limitations, risks, security and privacy concerns of performing an evaluation and management service by MyChart video and the availability of in person appointments. I also discussed with the patient that there may be a patient responsible charge related to this service. By engaging in this virtual visit, you consent to the provision of healthcare.  Additionally, you authorize for your insurance to be billed for the services provided during this visit.  The patient expressed understanding and agreed to proceed.  The following staff members participated in the virtual visit:  Maxwell Marion, RN    PRENATAL VISIT NOTE  Subjective:  Lisa Heath is a 30 y.o. G2P1001 at [redacted]w[redacted]d  for phone visit for ongoing prenatal care.  She is currently monitored for the following issues for this high-risk pregnancy and has Language barrier affecting health care; History of postpartum hemorrhage; Recurrent urticaria; Supervision of high risk pregnancy, antepartum; and Gestational thrombocytopenia (HCC) on their problem list.  Patient reports no complaints.  Contractions: Not present. Vag. Bleeding: None.  Movement: Present. Denies leaking of fluid.   H/o FGR earlier in pregnancy.  This has resolved with EFW 01/05/2021 3885gm (74%tile).  Normal fluid.  BPP 01/05/2021 8/8.  The following portions of the patient's history were reviewed and updated as appropriate: allergies, current medications, past family history, past medical history, past social history, past surgical history and problem list.   Objective:   Vitals:   01/06/21 1346  BP: 115/65   Self-Obtained  Fetal  Status:     Movement: Present     Assessment and Plan:  Pregnancy: G2P1001 at [redacted]w[redacted]d 1. [redacted] weeks gestation of pregnancy - on PNV - has NST scheduled Monday.  Plan to have cervix checked and confirm vertex. - d/w pt induction.  Due to schedule for inductions next week, will plan on Tuesday, Feb 1.   2. Supervision of high risk pregnancy, antepartum  3. Language barrier affecting health care - translator used throughout visit  4. Benign gestational thrombocytopenia in third trimester [facility]) - will need CBC at admission  Term labor symptoms and general obstetric precautions including but not limited to vaginal bleeding, contractions, leaking of fluid and fetal movement were reviewed in detail with the patient.   Future Appointments  Date Time Provider Department Center  01/10/2021 10:15 AM [facility] NST [facility]    Time spent on virtual visit: 12 minutes  Jerene Bears, MD

## 2021-01-07 ENCOUNTER — Encounter: Payer: Self-pay | Admitting: Obstetrics & Gynecology

## 2021-01-08 ENCOUNTER — Other Ambulatory Visit: Payer: Self-pay | Admitting: Student

## 2021-01-10 ENCOUNTER — Other Ambulatory Visit (HOSPITAL_COMMUNITY): Payer: Medicaid Other | Attending: Obstetrics and Gynecology

## 2021-01-10 ENCOUNTER — Telehealth (HOSPITAL_COMMUNITY): Payer: Self-pay | Admitting: *Deleted

## 2021-01-10 ENCOUNTER — Other Ambulatory Visit: Payer: Self-pay

## 2021-01-10 ENCOUNTER — Ambulatory Visit (INDEPENDENT_AMBULATORY_CARE_PROVIDER_SITE_OTHER): Payer: Medicaid Other | Admitting: *Deleted

## 2021-01-10 VITALS — BP 105/60 | HR 93 | Wt 138.0 lb

## 2021-01-10 DIAGNOSIS — O480 Post-term pregnancy: Secondary | ICD-10-CM | POA: Diagnosis not present

## 2021-01-10 NOTE — Telephone Encounter (Signed)
Interpreter number [phone]

## 2021-01-10 NOTE — Progress Notes (Signed)
IOL scheduled tomorrow

## 2021-01-11 ENCOUNTER — Inpatient Hospital Stay (HOSPITAL_COMMUNITY): Payer: Medicaid Other

## 2021-01-11 ENCOUNTER — Inpatient Hospital Stay (HOSPITAL_COMMUNITY)
Admission: AD | Admit: 2021-01-11 | Discharge: 2021-01-13 | DRG: 805 | Disposition: A | Discharge status: Home / Self Care | Payer: Medicaid Other | Attending: Obstetrics and Gynecology | Admitting: Obstetrics and Gynecology

## 2021-01-11 ENCOUNTER — Other Ambulatory Visit: Payer: Self-pay

## 2021-01-11 ENCOUNTER — Inpatient Hospital Stay (HOSPITAL_COMMUNITY): Payer: Medicaid Other | Admitting: Anesthesiology

## 2021-01-11 ENCOUNTER — Other Ambulatory Visit: Payer: Self-pay | Admitting: Advanced Practice Midwife

## 2021-01-11 ENCOUNTER — Encounter (HOSPITAL_COMMUNITY): Payer: Self-pay | Admitting: Student

## 2021-01-11 DIAGNOSIS — O9912 Other diseases of the blood and blood-forming organs and certain disorders involving the immune mechanism complicating childbirth: Secondary | ICD-10-CM | POA: Diagnosis present

## 2021-01-11 DIAGNOSIS — O9852 Other viral diseases complicating childbirth: Secondary | ICD-10-CM | POA: Diagnosis present

## 2021-01-11 DIAGNOSIS — Z603 Acculturation difficulty: Secondary | ICD-10-CM | POA: Diagnosis present

## 2021-01-11 DIAGNOSIS — Z789 Other specified health status: Secondary | ICD-10-CM | POA: Diagnosis present

## 2021-01-11 DIAGNOSIS — O9913 Other diseases of the blood and blood-forming organs and certain disorders involving the immune mechanism complicating the puerperium: Secondary | ICD-10-CM | POA: Diagnosis not present

## 2021-01-11 DIAGNOSIS — Z8759 Personal history of other complications of pregnancy, childbirth and the puerperium: Secondary | ICD-10-CM

## 2021-01-11 DIAGNOSIS — O480 Post-term pregnancy: Principal | ICD-10-CM | POA: Diagnosis present

## 2021-01-11 DIAGNOSIS — U071 COVID-19: Secondary | ICD-10-CM

## 2021-01-11 DIAGNOSIS — O99113 Other diseases of the blood and blood-forming organs and certain disorders involving the immune mechanism complicating pregnancy, third trimester: Secondary | ICD-10-CM | POA: Diagnosis not present

## 2021-01-11 DIAGNOSIS — D6959 Other secondary thrombocytopenia: Secondary | ICD-10-CM | POA: Diagnosis present

## 2021-01-11 DIAGNOSIS — D696 Thrombocytopenia, unspecified: Secondary | ICD-10-CM

## 2021-01-11 DIAGNOSIS — Z3A40 40 weeks gestation of pregnancy: Secondary | ICD-10-CM | POA: Diagnosis not present

## 2021-01-11 DIAGNOSIS — O0990 Supervision of high risk pregnancy, unspecified, unspecified trimester: Secondary | ICD-10-CM

## 2021-01-11 HISTORY — DX: COVID-19: U07.1

## 2021-01-11 LAB — CBC
HCT: 34.6 % — ABNORMAL LOW (ref 36.0–46.0)
HCT: 33.7 % — ABNORMAL LOW (ref 36.0–46.0)
Hemoglobin: 11.7 g/dL — ABNORMAL LOW (ref 12.0–15.0)
Hemoglobin: 12.1 g/dL (ref 12.0–15.0)
MCH: 29.8 pg (ref 26.0–34.0)
MCH: 31.5 pg (ref 26.0–34.0)
MCHC: 33.8 g/dL (ref 30.0–36.0)
MCHC: 35.9 g/dL (ref 30.0–36.0)
MCV: 88 fL (ref 80.0–100.0)
MCV: 87.8 fL (ref 80.0–100.0)
Platelets: 151 10*3/uL (ref 150–400)
Platelets: 145 10*3/uL — ABNORMAL LOW (ref 150–400)
RBC: 3.93 MIL/uL (ref 3.87–5.11)
RBC: 3.84 MIL/uL — ABNORMAL LOW (ref 3.87–5.11)
RDW: 13.3 % (ref 11.5–15.5)
RDW: 13.5 % (ref 11.5–15.5)
WBC: 7.8 10*3/uL (ref 4.0–10.5)
WBC: 9.2 10*3/uL (ref 4.0–10.5)
nRBC: 0 % (ref 0.0–0.2)
nRBC: 0 % (ref 0.0–0.2)

## 2021-01-11 LAB — TYPE AND SCREEN
ABO/RH(D): O POS
Antibody Screen: NEGATIVE

## 2021-01-11 LAB — SARS CORONAVIRUS 2 BY RT PCR (HOSPITAL ORDER, PERFORMED IN ~~LOC~~ HOSPITAL LAB): SARS Coronavirus 2: POSITIVE — AB

## 2021-01-11 MED ORDER — LACTATED RINGERS IV SOLN: INTRAVENOUS | Status: DC

## 2021-01-11 MED ORDER — PHENYLEPHRINE 40 MCG/ML (10ML) SYRINGE FOR IV PUSH (FOR BLOOD PRESSURE SUPPORT)
PREFILLED_SYRINGE | INTRAVENOUS | Status: AC
  Filled 2021-01-11: qty 10

## 2021-01-11 MED ORDER — LIDOCAINE HCL (PF) 1 % IJ SOLN: 30.0000 mL | INTRAMUSCULAR | Status: DC | PRN

## 2021-01-11 MED ORDER — TERBUTALINE SULFATE 1 MG/ML IJ SOLN: 0.2500 mg | Freq: Once | INTRAMUSCULAR | Status: DC | PRN

## 2021-01-11 MED ORDER — FENTANYL-BUPIVACAINE-NACL 0.5-0.125-0.9 MG/250ML-% EP SOLN
EPIDURAL | Status: DC | PRN
  Administered 2021-01-11: 12 mL/h via EPIDURAL

## 2021-01-11 MED ORDER — OXYTOCIN BOLUS FROM INFUSION
333.0000 mL | Freq: Once | INTRAVENOUS | Status: AC
Start: 2021-01-11 — End: 2021-01-12
  Administered 2021-01-12: 333 mL via INTRAVENOUS

## 2021-01-11 MED ORDER — OXYCODONE-ACETAMINOPHEN 5-325 MG PO TABS: 1.0000 | ORAL_TABLET | ORAL | Status: DC | PRN

## 2021-01-11 MED ORDER — FENTANYL-BUPIVACAINE-NACL 0.5-0.125-0.9 MG/250ML-% EP SOLN
EPIDURAL | Status: AC
  Filled 2021-01-11: qty 250

## 2021-01-11 MED ORDER — OXYTOCIN-SODIUM CHLORIDE 30-0.9 UT/500ML-% IV SOLN
2.5000 [IU]/h | INTRAVENOUS | Status: DC
  Filled 2021-01-11: qty 500

## 2021-01-11 MED ORDER — ACETAMINOPHEN 325 MG PO TABS: 650.0000 mg | ORAL_TABLET | ORAL | Status: DC | PRN

## 2021-01-11 MED ORDER — MISOPROSTOL 50MCG HALF TABLET
50.0000 ug | ORAL_TABLET | ORAL | Status: DC | PRN
  Administered 2021-01-11: 50 ug via BUCCAL

## 2021-01-11 MED ORDER — SOD CITRATE-CITRIC ACID 500-334 MG/5ML PO SOLN: 30.0000 mL | ORAL | Status: DC | PRN

## 2021-01-11 MED ORDER — LIDOCAINE-EPINEPHRINE (PF) 2 %-1:200000 IJ SOLN
INTRAMUSCULAR | Status: DC | PRN
  Administered 2021-01-11: 5 mL via EPIDURAL

## 2021-01-11 MED ORDER — MISOPROSTOL 50MCG HALF TABLET
ORAL_TABLET | ORAL | Status: AC
  Filled 2021-01-11: qty 1

## 2021-01-11 MED ORDER — ONDANSETRON HCL 4 MG/2ML IJ SOLN: 4.0000 mg | Freq: Four times a day (QID) | INTRAMUSCULAR | Status: DC | PRN

## 2021-01-11 MED ORDER — LACTATED RINGERS IV SOLN
500.0000 mL | INTRAVENOUS | Status: DC | PRN
  Administered 2021-01-11 – 2021-01-12 (×2): 1000 mL via INTRAVENOUS

## 2021-01-11 MED ORDER — OXYCODONE-ACETAMINOPHEN 5-325 MG PO TABS: 2.0000 | ORAL_TABLET | ORAL | Status: DC | PRN

## 2021-01-11 MED ORDER — OXYTOCIN-SODIUM CHLORIDE 30-0.9 UT/500ML-% IV SOLN
1.0000 m[IU]/min | INTRAVENOUS | Status: DC
  Administered 2021-01-11: 2 m[IU]/min via INTRAVENOUS

## 2021-01-11 NOTE — Progress Notes (Addendum)
Name: Lisa Heath   MRN: [id number]    DOB: [birth year]   Date:01/11/2021       Progress Note  Subjective  Chief Complaint  Patient feeling uncomfortable and desires epidural at this time.  Denies respiratory distress, breathing difficulty, chest pain, or leg swelling or pain.   ROS  Information is negative other than what is reported above. Information obtained verbally from patient.  Objective  Vitals:   01/11/21 1421 01/11/21 1640 01/11/21 1755 01/11/21 1930  BP: 101/62 105/62 (!) 93/55 103/60  Pulse: 85 85 72 83  Resp:   17 18  Temp:   98 F (36.7 C) 98 F (36.7 C)  TempSrc:    Oral   FHT 125 , +accels, no decels Contrations 1-1.5 mins apart  Physical exam deferred   Cervical exam deferred at this time.  Previous exam: Dilation: 4 Effacement (%): 70 Cervical Position: Posterior Station: -3 Presentation: Vertex Exam by:: Irving Burton Chipps RN   Assessment   Admission for IOL for IUGR  Latent phase of labor  Language barrier - Arabic    Plan  G2P1001 at [redacted]w[redacted]d IOL, continue pitocin at 10 milU/min  Epidural ordered  Anticipatory guidance regarding course of labor management   Juliann Pares, Student-MidWife [employer] 01/11/21 9:12 PM  Attestation:  I confirm that I have verified the information documented in the SNM's note and that I have also personally reperformed the physical exam and all medical decision making activities.   IPAD translator not working.  Husband speaks good [place] and patient speaks some [place].  They both consent to him providing translation until we can arrange another IPAD They both state understanding and have no questions  Aviva Signs, CNM

## 2021-01-11 NOTE — Progress Notes (Signed)
Interpreter services used for Epidural placement Joyce Gross [id number].

## 2021-01-11 NOTE — Anesthesia Preprocedure Evaluation (Signed)
Anesthesia Evaluation  Patient identified by MRN, date of birth, ID band Patient awake    Reviewed: Allergy & Precautions, NPO status , Patient's Chart, lab work & pertinent test results  Airway Mallampati: II  TM Distance: >3 FB Neck ROM: Full    Dental no notable dental hx.    Pulmonary neg pulmonary ROS,    Pulmonary exam normal breath sounds clear to auscultation       Cardiovascular negative cardio ROS Normal cardiovascular exam Rhythm:Regular Rate:Normal     Neuro/Psych negative neurological ROS  negative psych ROS   GI/Hepatic negative GI ROS, Neg liver ROS,   Endo/Other  negative endocrine ROS  Renal/GU negative Renal ROS  negative genitourinary   Musculoskeletal negative musculoskeletal ROS (+)   Abdominal   Peds  Hematology  (+) Blood dyscrasia (gestational thrombocytopenia), ,   Anesthesia Other Findings COVID positive  Reproductive/Obstetrics (+) Pregnancy                             Anesthesia Physical Anesthesia Plan  ASA: III  Anesthesia Plan: Epidural   Post-op Pain Management:    Induction:   PONV Risk Score and Plan: Treatment may vary due to age or medical condition  Airway Management Planned: Natural Airway  Additional Equipment:   Intra-op Plan:   Post-operative Plan:   Informed Consent: I have reviewed the patients History and Physical, chart, labs and discussed the procedure including the risks, benefits and alternatives for the proposed anesthesia with the patient or authorized representative who has indicated his/her understanding and acceptance.       Plan Discussed with: Anesthesiologist  Anesthesia Plan Comments: (Patient identified. Risks, benefits, options discussed with patient including but not limited to bleeding, infection, nerve damage, paralysis, failed block, incomplete pain control, headache, blood pressure changes, nausea,  vomiting, reactions to medication, itching, and post partum back pain. Confirmed with bedside nurse the patient's most recent platelet count. Confirmed with the patient that they are not taking any anticoagulation, have any bleeding history or any family history of bleeding disorders. Patient expressed understanding and wishes to proceed. All questions were answered. )        Anesthesia Quick Evaluation

## 2021-01-11 NOTE — Anesthesia Procedure Notes (Signed)
Epidural Patient location during procedure: OB Start time: 01/11/2021 8:55 PM End time: 01/11/2021 9:05 PM  Staffing Anesthesiologist: Elmer Picker, MD Performed: anesthesiologist   Preanesthetic Checklist Completed: patient identified, IV checked, risks and benefits discussed, monitors and equipment checked, pre-op evaluation and timeout performed  Epidural Patient position: sitting Prep: DuraPrep and site prepped and draped Patient monitoring: continuous pulse ox, blood pressure, heart rate and cardiac monitor Approach: midline Location: L3-L4 Injection technique: LOR air  Needle:  Needle type: Tuohy  Needle gauge: 17 G Needle length: 9 cm Needle insertion depth: 4 cm Catheter type: closed end flexible Catheter size: 19 Gauge Catheter at skin depth: 10 cm Test dose: negative  Assessment Sensory level: T8 Events: blood not aspirated, injection not painful, no injection resistance, no paresthesia and negative IV test  Additional Notes Patient identified. Risks/Benefits/Options discussed with patient including but not limited to bleeding, infection, nerve damage, paralysis, failed block, incomplete pain control, headache, blood pressure changes, nausea, vomiting, reactions to medication both or allergic, itching and postpartum back pain. Confirmed with bedside nurse the patient's most recent platelet count. Confirmed with patient that they are not currently taking any anticoagulation, have any bleeding history or any family history of bleeding disorders. Patient expressed understanding and wished to proceed. All questions were answered. Sterile technique was used throughout the entire procedure. Please see nursing notes for vital signs. Test dose was given through epidural catheter and negative prior to continuing to dose epidural or start infusion. Warning signs of high block given to the patient including shortness of breath, tingling/numbness in hands, complete motor block, or  any concerning symptoms with instructions to call for help. Patient was given instructions on fall risk and not to get out of bed. All questions and concerns addressed with instructions to call with any issues or inadequate analgesia.  Reason for block:procedure for pain

## 2021-01-11 NOTE — H&P (Addendum)
OBSTETRIC ADMISSION HISTORY AND PHYSICAL  Lisa Heath is a 30 y.o. female G2P1001 with IUP at 88w5dby [place] I[place]. She reports +FMs, No LOF, no VB, no blurry vision, headaches or peripheral edema, and RUQ pain.  She plans on bottle feeding. She requests OCPs for birth control. [facility]  Dating: [place]--->  Estimated Date of Delivery: 01/06/21  Sono:    _0 , CWD, normal anatomy, cephalic presentation,  31694H 74% EFW  Prenatal History/Complications:  Hx PPH Gestational thrombocytopenia  h/o fetal growth restriction early in pregnancy- resolved with EFW 01/05/21 at 3885 (74%tile). Normal fluid. BPP 01/05/21 8/8  Past Medical History: Past Medical History:  Diagnosis Date  . Allergic reaction 02/18/2020  . Angioedema 02/18/2020  . GBS (group B Streptococcus carrier), +RV culture, currently pregnant 04/03/2019  . Left breast mass   . Other allergic rhinitis 02/18/2020  . Vaginal Pap smear, abnormal   . Venereal warts     Past Surgical History: Past Surgical History:  Procedure Laterality Date  . COLPOSCOPY      Obstetrical History: OB History    Gravida  2   Para  1   Term  1   Preterm      AB      Living  1     SAB      IAB      Ectopic      Multiple  0   Live Births  1           Social History Social History   Socioeconomic History  . Marital status: Married    Spouse name: Not on file  . Number of children: Not on file  . Years of education: Not on file  . Highest education level: Not on file  Occupational History  . Not on file  Tobacco Use  . Smoking status: Never Smoker  . Smokeless tobacco: Never Used  Vaping Use  . Vaping Use: Never used  Substance and Sexual Activity  . Alcohol use: Never  . Drug use: Never  . Sexual activity: Yes    Birth control/protection: None  Other Topics Concern  . Not on file  Social History Narrative  . Not on file   Social  Determinants of Health   Financial Resource Strain: Not on file  Food Insecurity: No Food Insecurity  . Worried About RCharity fundraiserin the Last Year: Never true  . Ran Out of Food in the Last Year: Never true  Transportation Needs: No Transportation Needs  . Lack of Transportation (Medical): No  . Lack of Transportation (Non-Medical): No  Physical Activity: Not on file  Stress: Not on file  Social Connections: Not on file    Family History: Family History  Problem Relation Age of Onset  . Asthma Neg Hx   . Allergic rhinitis Neg Hx   . Eczema Neg Hx   . Urticaria Neg Hx   . Immunodeficiency Neg Hx   . Angioedema Neg Hx     Allergies: No Known Allergies  Pt denies allergies to latex, iodine, or shellfish.  Medications Prior to Admission  Medication Sig Dispense Refill Last Dose  . Blood Pressure Monitoring (BLOOD PRESSURE KIT) DEVI 1 Device by Does not apply route daily. ICD 10: Z34.00 1 each 0   . famotidine (PEPCID) 20 MG tablet Take 1 tablet (20 mg total) by mouth 2 (two) times daily  as needed for heartburn or indigestion. 60 tablet 5   . prenatal vitamin w/FE, FA (PRENATAL 1 + 1) 27-1 MG TABS tablet Take 1 tablet by mouth daily at 12 noon. 30 tablet 6      Review of Systems   All systems reviewed and negative except as stated in HPI  unknown if currently breastfeeding. General appearance: alert, cooperative and no distress Lungs: Normal WOB Extremities: No sign of DVT Presentation: cephalic Fetal monitoringBaseline: 125 bpm, Variability: Good {> 6 bpm), Accelerations: Reactive and Decelerations: Absent Uterine activityFrequency: irregular  Dilation: 1.5 Effacement (%): Thick Station: Ballotable Exam by:: Drinda Butts RN   Prenatal labs: ABO, Rh: --/--/PENDING (02/01 4098) Antibody: PENDING (02/01 0942) Rubella: Immune (07/29 0000) RPR: Non Reactive (11/12 0919)  HBsAg: Negative (07/29 0000)  HIV: Non Reactive (11/12 0919)  GBS: Negative/-- (01/06  1054)  2 hr Glucola: 62  Genetic screening:   Anatomy US: IUGR EFW 9th % on 10/20 that has since resolved   Prenatal Transfer Tool  Maternal Diabetes: No Genetic Screening: Normal Maternal Ultrasounds/Referrals: Normal Fetal Ultrasounds or other Referrals:  None Maternal Substance Abuse:  No Significant Maternal Medications:  None Significant Maternal Lab Results: Group B Strep negative and Other: Covid+ 01/11/21  Results for orders placed or performed during the hospital encounter of 01/11/21 (from the past 24 hour(s))  Type and screen   Collection Time: 01/11/21  9:42 AM  Result Value Ref Range   ABO/RH(D) PENDING    Antibody Screen PENDING    Sample Expiration      01/14/2021,2359 Performed at [facility], [place] [address], [place], [place] [address]     Patient Active Problem List   Diagnosis Date Noted  . Indication for care in labor or delivery 01/11/2021  . Gestational thrombocytopenia ([place]) 10/23/2020  . Supervision of high risk pregnancy, antepartum 10/20/2020  . Recurrent urticaria 02/18/2020  . History of postpartum hemorrhage 04/04/2019  . Language barrier affecting health care 12/16/2018    Assessment/Plan:  Lisa Heath is a 30 y.o. G2P1001 at [redacted]w[redacted]d here for PD IOL   #IOL: SVE 1.5 cm. Cytotec started at 1020. At next check will consider another dose of cytotec and potentially inserting FB vs starting Pitocin if cervix has made change  #Pain: Desires epidural  #FWB: Cat 1 strip  #ID: GBS neg #MOF: bottle  #MOC: OCPs #gestational thrombocytopenia: platelets 151. Will continue to monitor. Will consider TXA if clinically indicated  #h/o fetal growth restriction early in pregnancy- resolved with EFW 01/05/21 at 3885 (74%tile). Normal fluid. BPP 01/05/21 8/8  [place] for [employer], [place] 01/11/2021, 10:19 AM   CNM attestation:  I have seen and examined this patient; I agree with above documentation in  the resident's note.   Lisa Heath is a 30 y.o. G2P1001 here for IOL due to postdates. Of note, she had FGR earlier in the pregnancy (25wks) which resolved by 30wks and fetus remained normally grown since then; she has also had mild gest thrombocytopenia.  PE: BP 106/64   Pulse 88   Temp 98.1 F (36.7 C) (Oral)   Resp 18   LMP  (LMP Unknown)  Gen: calm comfortable, NAD Resp: normal effort, no distress Abd: gravid  ROS, labs, PMH reviewed  -Plts: 151,000 -Covid+ on admission  Plan: -Admit to Labor and Delivery -Plan cx ripening with cytotec to start; at reassessment will decide if cytotec will be repeated with a cervical  foley added, or if Pitocin will be started. -Use airborne precautions -Anticipate vag del  Myrtis Ser CNM 01/11/2021, 2:17 PM

## 2021-01-11 NOTE — Progress Notes (Signed)
Patient ID: Lisa Heath, female   DOB: [birth year], 30 y.o.   MRN: [id number]  Feeling a little crampy, but not really uncomfortable; s/p cytotec x 1 doses (1015) then Pit since 1430  BP 93/55, P 72 FHR 125-130, +accels, no decels Ctx q 2 mins with Pit now at 54mu/min Cx 4/70/vtx -3  IUP@40 .5wks IOL process  Continue to titrate Pit to achieve active labor Anticipate being able to AROM as a future plan Anticipate vag del  Lisa Heath CNM 01/11/2021

## 2021-01-12 ENCOUNTER — Encounter (HOSPITAL_COMMUNITY): Payer: Self-pay | Admitting: Student

## 2021-01-12 DIAGNOSIS — Z3A40 40 weeks gestation of pregnancy: Secondary | ICD-10-CM

## 2021-01-12 LAB — RPR: RPR Ser Ql: NONREACTIVE

## 2021-01-12 MED ORDER — LACTATED RINGERS IV BOLUS: 500.0000 mL | Freq: Once | INTRAVENOUS | Status: DC

## 2021-01-12 MED ORDER — PRENATAL MULTIVITAMIN CH
1.0000 | ORAL_TABLET | Freq: Every day | ORAL | Status: DC
  Administered 2021-01-12: 1 via ORAL
  Filled 2021-01-12: qty 1

## 2021-01-12 MED ORDER — TETANUS-DIPHTH-ACELL PERTUSSIS 5-2.5-18.5 LF-MCG/0.5 IM SUSY: 0.5000 mL | PREFILLED_SYRINGE | Freq: Once | INTRAMUSCULAR | Status: DC

## 2021-01-12 MED ORDER — ONDANSETRON HCL 4 MG PO TABS: 4.0000 mg | ORAL_TABLET | ORAL | Status: DC | PRN

## 2021-01-12 MED ORDER — TRANEXAMIC ACID-NACL 1000-0.7 MG/100ML-% IV SOLN: 1000.0000 mg | Freq: Once | INTRAVENOUS | Status: AC

## 2021-01-12 MED ORDER — ONDANSETRON HCL 4 MG/2ML IJ SOLN: 4.0000 mg | INTRAMUSCULAR | Status: DC | PRN

## 2021-01-12 MED ORDER — METHYLERGONOVINE MALEATE 0.2 MG PO TABS: 0.2000 mg | ORAL_TABLET | ORAL | Status: DC | PRN

## 2021-01-12 MED ORDER — TRANEXAMIC ACID-NACL 1000-0.7 MG/100ML-% IV SOLN
INTRAVENOUS | Status: AC
  Administered 2021-01-12: 1000 mg via INTRAVENOUS
  Filled 2021-01-12: qty 100

## 2021-01-12 MED ORDER — DIPHENHYDRAMINE HCL 25 MG PO CAPS: 25.0000 mg | ORAL_CAPSULE | Freq: Four times a day (QID) | ORAL | Status: DC | PRN

## 2021-01-12 MED ORDER — DIBUCAINE (PERIANAL) 1 % EX OINT: 1.0000 "application " | TOPICAL_OINTMENT | CUTANEOUS | Status: DC | PRN

## 2021-01-12 MED ORDER — IBUPROFEN 600 MG PO TABS
600.0000 mg | ORAL_TABLET | Freq: Four times a day (QID) | ORAL | Status: DC
  Administered 2021-01-12 – 2021-01-13 (×5): 600 mg via ORAL
  Filled 2021-01-12 (×5): qty 1

## 2021-01-12 MED ORDER — SENNOSIDES-DOCUSATE SODIUM 8.6-50 MG PO TABS
2.0000 | ORAL_TABLET | ORAL | Status: DC
  Administered 2021-01-12: 2 via ORAL
  Filled 2021-01-12: qty 2

## 2021-01-12 MED ORDER — ZOLPIDEM TARTRATE 5 MG PO TABS: 5.0000 mg | ORAL_TABLET | Freq: Every evening | ORAL | Status: DC | PRN

## 2021-01-12 MED ORDER — BENZOCAINE-MENTHOL 20-0.5 % EX AERO: 1.0000 "application " | INHALATION_SPRAY | CUTANEOUS | Status: DC | PRN

## 2021-01-12 MED ORDER — COCONUT OIL OIL: 1.0000 "application " | TOPICAL_OIL | Status: DC | PRN

## 2021-01-12 MED ORDER — METHYLERGONOVINE MALEATE 0.2 MG/ML IJ SOLN: 0.2000 mg | INTRAMUSCULAR | Status: DC | PRN

## 2021-01-12 MED ORDER — ACETAMINOPHEN 325 MG PO TABS: 650.0000 mg | ORAL_TABLET | ORAL | Status: DC | PRN

## 2021-01-12 MED ORDER — SIMETHICONE 80 MG PO CHEW: 80.0000 mg | CHEWABLE_TABLET | ORAL | Status: DC | PRN

## 2021-01-12 MED ORDER — WITCH HAZEL-GLYCERIN EX PADS: 1.0000 "application " | MEDICATED_PAD | CUTANEOUS | Status: DC | PRN

## 2021-01-12 MED ORDER — MISOPROSTOL 200 MCG PO TABS
800.0000 ug | ORAL_TABLET | Freq: Once | ORAL | Status: AC
  Administered 2021-01-12: 800 ug via BUCCAL
  Filled 2021-01-12: qty 4

## 2021-01-12 NOTE — Progress Notes (Signed)
MD on call aware of patients BP of 87/53 pulse 66. Patient states she feels fine and lochia is small no blood clots noted.

## 2021-01-12 NOTE — Progress Notes (Signed)
Plan of care explained to FOB. FOB wanted to leave and I told him if he leaves he can not come back. He stated he was not informed of that and stated he wants to go home with mom and baby today. AMA papers explained. He stated he wanted to talk to [facility] and the pediatrician. Both were called and updated that FOB wanted to leave. Waiting for FOB to tell me what he wishes to do.

## 2021-01-12 NOTE — Social Work (Signed)
CSW received consult due to score 10 on Edinburgh Depression Screen.    CSW contacted MOB by phone due to Covid positive status. CSW used [employer] #[id number]. CSW introduced self and role. CSW informed MOB of reason for consult and assessed MOB current emotions. MOB reported she is currently doing well. MOB denies any stressors and stated she had a good pregnancy. MOB denied any history of anxious or depressive symptoms. MOB reported FOB Hiyam is a support. MOB denies any current SI, HI or being involved in domestic violence.   CSW provided education regarding the baby blues period vs. perinatal mood disorders and discussed treatment for mental health follow up if concerns arise. CSW recommended self-evaluation during the postpartum time period using the New Mom Checklist from Postpartum Progress and encouraged MOB to contact a medical professional if symptoms are noted at any time.   CSW provided review of Sudden Infant Death Syndrome (SIDS) precautions.  MOB reported she has all essential needs for baby including a bassinet. MOB identified [facility] for follow-up care and denies any barriers to care. MOB expressed no additional needs at this time.   CSW identifies no further need for intervention and no barriers to discharge at this time.  Tiny Rietz, LCSWA Clinical Social Work [facility] [phone]  

## 2021-01-12 NOTE — Lactation Note (Signed)
This note was copied from a baby's chart. Lactation Consultation Note  Patient Name: Girl Finola Rosal IRSWN'I Date: 01/12/2021   Age:30 hours  I spoke with Sherri Sear, RN and let her know that this patient would likely prefer halal formula. RN to ask parents if they prefer halal and will provide Similac if they do.    Lurline Hare [facility] 01/12/2021, 9:17 AM

## 2021-01-12 NOTE — Anesthesia Postprocedure Evaluation (Signed)
Anesthesia Post Note  Patient: Lisa Heath  Procedure(s) Performed: AN AD HOC LABOR EPIDURAL     Patient location during evaluation: Mother Baby Anesthesia Type: Epidural Level of consciousness: awake and alert Pain management: pain level controlled Vital Signs Assessment: post-procedure vital signs reviewed and stable Respiratory status: spontaneous breathing, nonlabored ventilation and respiratory function stable Cardiovascular status: stable Postop Assessment: no headache, no backache and epidural receding Anesthetic complications: no   No complications documented.  Last Vitals:  Vitals:   01/12/21 1156 01/12/21 1421  BP: (!) 85/63 (!) 87/53  Pulse: 77 66  Resp: 18 18  Temp: 36.7 C 36.6 C  SpO2: 100% 100%    Last Pain:  Vitals:   01/12/21 1425  TempSrc:   PainSc: 3    Pain Goal:                   Jarmal Lewelling

## 2021-01-12 NOTE — Progress Notes (Signed)
FOB is persistant about going out to his car or to go smoke. I have told him multiple times if he leaves he can't come back into room per protocol. I have educated him on hospital protocol. He is anxious to leave with mom and infant,  I told him he would need to wait until the morning when the doctors make rounds to see what they say about discharge.

## 2021-01-12 NOTE — Discharge Summary (Signed)
Postpartum Discharge Summary  Date of Service updated 01/13/21     Patient Name: Lisa Heath [facility] DOB: 26-Mar-1991 MRN: [id number]  Date of admission: 01/11/2021 Delivery date:01/12/2021  Delivering provider: Seabron Spates  Date of discharge: 01/13/2021  Admitting diagnosis: Indication for care in labor or delivery [O75.9] Intrauterine pregnancy: [redacted]w[redacted]d    Secondary diagnosis:  Active Problems:   Language barrier affecting health care   Vaginal delivery   History of postpartum hemorrhage   Supervision of high risk pregnancy, antepartum   [place]ational thrombocytopenia ([place]   Indication for care in labor or delivery   PHuron(postpartum hemorrhage)  Additional problems: none    Discharge diagnosis: Term Pregnancy Delivered                                              Post partum procedures:none Augmentation: Pitocin and Cytotec Complications: None  Hospital course: Induction of Labor With Vaginal Delivery   30y.o. yo G2P1001 at 439w6das admitted to the hospital 01/11/2021 for induction of labor.  Indication for induction: Postdates.  Patient had an uncomplicated labor course as follows: Membrane Rupture Time/Date: 9:25 PM ,01/11/2021   Delivery Method:Vaginal, Spontaneous  Episiotomy: None  Lacerations:  Vaginal  Of note patient did have EBL 60041mnd was given cytotec, methergine, and txa after delivery. She is asymptomatic at discharge and discharged on PO iron. Details of delivery can be found in separate delivery note.  Patient had a routine postpartum course. Patient is discharged home 01/13/21.  Newborn Data: Birth date:01/12/2021  Birth time:12:08 AM  Gender:Female  Living status:Living  Apgars:8 ,9  Weight:3730 g   Magnesium Sulfate received: No BMZ received: No Rhophylac:N/A MMR:N/A T-DaP:Given prenatally Flu: N/A Transfusion:No  Physical exam  Vitals:   01/12/21 1156 01/12/21 1421 01/12/21 1817 01/12/21 2037  BP: (!) 85/63 (!) 87/53 96/60 104/68  Pulse: 77  66 72 73  Resp: '18 18  18  ' Temp: 98.1 F (36.7 C) 97.9 F (36.6 C)  98 F (36.7 C)  TempSrc:    Oral  SpO2: 100% 100%  100%   Refer to progress note written by MarHansel FeinsteinNM for physical exam  Labs: Lab Results  Component Value Date   WBC 9.2 01/11/2021   HGB 12.1 01/11/2021   HCT 33.7 (L) 01/11/2021   MCV 87.8 01/11/2021   PLT 145 (L) 01/11/2021   CMP Latest Ref Rng & Units 03/05/2020  Glucose 65 - 99 mg/dL 87  BUN 6 - 20 mg/dL 9  Creatinine 0.57 - 1.00 mg/dL 0.60  Sodium 134 - 144 mmol/L 143  Potassium 3.5 - 5.2 mmol/L 4.0  Chloride 96 - 106 mmol/L 106  CO2 20 - 29 mmol/L 18(L)  Calcium 8.7 - 10.2 mg/dL 9.2  Total Protein 6.0 - 8.5 g/dL 7.1  Total Bilirubin 0.0 - 1.2 mg/dL 0.6  Alkaline Phos 39 - 117 IU/L 85  AST 0 - 40 IU/L 17  ALT 0 - 32 IU/L 8   Edinburgh Score: Edinburgh Postnatal Depression Scale Screening Tool 01/12/2021  I have been able to laugh and see the funny side of things. 0  I have looked forward with enjoyment to things. 0  I have blamed myself unnecessarily when things went wrong. 3  I have been anxious or worried for no good reason. 0  I have felt scared or  panicky for no good reason. 3  Things have been getting on top of me. 3  I have been so unhappy that I have had difficulty sleeping. 0  I have felt sad or miserable. 1  I have been so unhappy that I have been crying. 0  The thought of harming myself has occurred to me. 0  Edinburgh Postnatal Depression Scale Total 10      After visit meds:  Allergies as of 01/13/2021   No Known Allergies     Medication List    STOP taking these medications   prenatal vitamin w/FE, FA 27-1 MG Tabs tablet     TAKE these medications   acetaminophen 500 MG tablet Commonly known as: TYLENOL Take 2 tablets (1,000 mg total) by mouth every 6 (six) hours as needed (for pain scale < 4).   ascorbic acid 250 MG tablet Commonly known as: VITAMIN C Take 1 tablet (250 mg total) by mouth every other day.    Blood Pressure Kit Devi 1 Device by Does not apply route daily. ICD 10: Z34.00   ferrous sulfate 325 (65 FE) MG tablet Take 1 tablet (325 mg total) by mouth every other day.   ibuprofen 600 MG tablet Commonly known as: ADVIL Take 1 tablet (600 mg total) by mouth every 6 (six) hours as needed.       Please schedule this patient for Postpartum visit in: 4 weeks with the following provider: Any provider virtual or inperson For C/S patients schedule nurse incision check in weeks 2 weeks: no High risk pregnancy complicated by: IUGR  (but baby was actually normal size) Delivery mode:  SVD Anticipated Birth Control:  OCPs, send rx at postpartum visit PP Procedures needed: none  Edinburgh: negative Schedule Integrated [place] visit: no  No relevant baby issues     Discharge home in stable condition Infant Feeding: Breast Infant Disposition:home with mother Discharge instruction: per After Visit Summary and Postpartum booklet. Activity: Advance as tolerated. Pelvic rest for 6 weeks.  Diet: routine diet Anticipated Birth Control: POPs Postpartum Appointment:4 weeks Additional Postpartum F/U: none Future Appointments:No future appointments. Follow up Visit:      08/18/3381 Arrie Senate, MD

## 2021-01-12 NOTE — Progress Notes (Signed)
Patient ID: Lisa Heath, female   DOB: 1991/04/27, 30 y.o.   MRN: [id number] Patient experienced increased postpartum bleeding with clots Vitals:   01/12/21 0000 01/12/21 0030 01/12/21 0045 01/12/21 0100  BP: 121/75 (!) 91/54 (!) 97/48 (!) 89/68  Pulse: 82 (!) 107 93 84  Resp: 16 15 14 16   Temp:      TempSrc:      SpO2:       IV bolus ordered TXA ordered Cytotec buccal ordered  Will watch closely

## 2021-01-13 DIAGNOSIS — O9913 Other diseases of the blood and blood-forming organs and certain disorders involving the immune mechanism complicating the puerperium: Secondary | ICD-10-CM

## 2021-01-13 MED ORDER — ACETAMINOPHEN 500 MG PO TABS: 1000.0000 mg | ORAL_TABLET | Freq: Four times a day (QID) | ORAL | Status: DC | PRN

## 2021-01-13 MED ORDER — FERROUS SULFATE 325 (65 FE) MG PO TABS
325.0000 mg | ORAL_TABLET | ORAL | Status: DC
  Administered 2021-01-13: 325 mg via ORAL
  Filled 2021-01-13: qty 1

## 2021-01-13 MED ORDER — FERROUS SULFATE 325 (65 FE) MG PO TABS: 325.0000 mg | ORAL_TABLET | ORAL | 3 refills | Status: DC

## 2021-01-13 MED ORDER — VITAMIN C 250 MG PO TABS
250.0000 mg | ORAL_TABLET | ORAL | Status: DC
  Administered 2021-01-13: 250 mg via ORAL
  Filled 2021-01-13: qty 1

## 2021-01-13 MED ORDER — IBUPROFEN 600 MG PO TABS: 600.0000 mg | ORAL_TABLET | Freq: Four times a day (QID) | ORAL | 0 refills | Status: DC | PRN

## 2021-01-13 MED ORDER — ASCORBIC ACID 250 MG PO TABS: 250.0000 mg | ORAL_TABLET | ORAL | Status: DC

## 2021-01-13 NOTE — Progress Notes (Addendum)
FOB requested to leave the room to use the microwave.  He expressed that he had not had any food.  I explained that due to the MOB being Covid +, he couldn't leave the room and would need to leave and not return if he left the room again.  OB provider and pediatrician advised. OB provider agreed to discharge MOB.  Pediatrician advised "there's nothing I can do."  Explained to FOB that they would be able to leave after paperwork was complete.  After further discussion, the FOB decided not to leave.  I again explained that he would not be able to leave the room for any reason.

## 2021-01-13 NOTE — Progress Notes (Signed)
FOB called out stating he'd been waiting since 9pm for food. FOB had previously requested to use microwave and was told our policy did not allow for him to use microwave and he couldn't leave room because of COVID positive mom. He requested to see the Mining engineer". FOB came out of room irate and staff member requested for him to return to room and he obliged. Charge nurse, AC, and security all called. AC spoke with FOB and requested to leave. OB provider agreed to discharge MOB and Pediatrician states "there's nothing I can do, call house coverage". Explained to FOB mom would be discharged, but baby would be AMA and form would need to be signed and CPS would be called. FOB refused to sign AMA form and decided to stay. MOB discharge reversed.

## 2021-01-13 NOTE — Discharge Summary (Addendum)
Postpartum Discharge Summary     Patient Name: Lisa Heath [facility] DOB: [birth year] MRN: [id number]  Date of admission: 01/11/2021 Delivery date:01/12/2021  Delivering provider: Seabron Spates  Date of discharge: 01/13/2021  Admitting diagnosis: Indication for care in labor or delivery [O75.9] Intrauterine pregnancy: [redacted]w[redacted]d    Secondary diagnosis:  Active Problems:   Language barrier affecting health care   Vaginal delivery   History of postpartum hemorrhage   Supervision of high risk pregnancy, antepartum   [place]ocytopenia ([place]   Indication for care in labor or delivery   PEl Quiote(postpartum hemorrhage)  Additional problems: none    Discharge diagnosis: Term Pregnancy Delivered                                              Post partum procedures:none Augmentation: Pitocin and Cytotec Complications: None  Hospital course: Induction of Labor With Vaginal Delivery   30y.o. yo G2P1001 at 438w6das admitted to the hospital 01/11/2021 for induction of labor.  Indication for induction: Postdates.  Patient had an uncomplicated labor course as follows: Membrane Rupture Time/Date: 9:25 PM ,01/11/2021   Delivery Method:Vaginal, Spontaneous  Episiotomy: None  Lacerations:  Vaginal  Of note patient did have EBL 60053mnd was given cytotec, methergine, and txa after delivery. She is asymptomatic at discharge and discharged on PO iron. Details of delivery can be found in separate delivery note.  Patient had a routine postpartum course. Patient is discharged home 01/13/21.  Newborn Data: Birth date:01/12/2021  Birth time:12:08 AM  Gender:Female  Living status:Living  Apgars:8 ,9  Weight:3730 g (8lb 3.6oz)  Magnesium Sulfate received: No BMZ received: No Rhophylac:N/A MMR:N/A T-DaP:Given prenatally Flu: N/A Transfusion:No  Physical exam  Vitals:   01/12/21 1421 01/12/21 1817 01/12/21 2037 01/13/21 0555  BP: (!) 87/53 96/60 104/68 (!) 99/55  Pulse: 66 72 73 64  Resp: '18   18 18  ' Temp: 97.9 F (36.6 C)  98 F (36.7 C) 97.8 F (36.6 C)  TempSrc:   Oral Oral  SpO2: 100%  100% 100%   General: alert, no distress Abd: soft, nontender Fundus: firm Pelvic: lochia nl Ext: no s/s DVT  Labs: Lab Results  Component Value Date   WBC 9.2 01/11/2021   HGB 12.1 01/11/2021   HCT 33.7 (L) 01/11/2021   MCV 87.8 01/11/2021   PLT 145 (L) 01/11/2021   CMP Latest Ref Rng & Units 03/05/2020  Glucose 65 - 99 mg/dL 87  BUN 6 - 20 mg/dL 9  Creatinine 0.57 - 1.00 mg/dL 0.60  Sodium 134 - 144 mmol/L 143  Potassium 3.5 - 5.2 mmol/L 4.0  Chloride 96 - 106 mmol/L 106  CO2 20 - 29 mmol/L 18(L)  Calcium 8.7 - 10.2 mg/dL 9.2  Total Protein 6.0 - 8.5 g/dL 7.1  Total Bilirubin 0.0 - 1.2 mg/dL 0.6  Alkaline Phos 39 - 117 IU/L 85  AST 0 - 40 IU/L 17  ALT 0 - 32 IU/L 8   Edinburgh Score: Edinburgh Postnatal Depression Scale Screening Tool 01/12/2021  I have been able to laugh and see the funny side of things. 0  I have looked forward with enjoyment to things. 0  I have blamed myself unnecessarily when things went wrong. 3  I have been anxious or worried for no good reason. 0  I have felt scared or panicky  for no good reason. 3  Things have been getting on top of me. 3  I have been so unhappy that I have had difficulty sleeping. 0  I have felt sad or miserable. 1  I have been so unhappy that I have been crying. 0  The thought of harming myself has occurred to me. 0  Edinburgh Postnatal Depression Scale Total 10      After visit meds:  Allergies as of 01/13/2021   No Known Allergies     Medication List    STOP taking these medications   prenatal vitamin w/FE, FA 27-1 MG Tabs tablet     TAKE these medications   acetaminophen 500 MG tablet Commonly known as: TYLENOL Take 2 tablets (1,000 mg total) by mouth every 6 (six) hours as needed (for pain scale < 4).   ascorbic acid 250 MG tablet Commonly known as: VITAMIN C Take 1 tablet (250 mg total) by mouth every  other day.   Blood Pressure Kit Devi 1 Device by Does not apply route daily. ICD 10: Z34.00   ferrous sulfate 325 (65 FE) MG tablet Take 1 tablet (325 mg total) by mouth every other day.   ibuprofen 600 MG tablet Commonly known as: ADVIL Take 1 tablet (600 mg total) by mouth every 6 (six) hours as needed.       Please schedule this patient for Postpartum visit in: 4 weeks with the following provider: Any provider virtual or inperson For C/S patients schedule nurse incision check in weeks 2 weeks: no High risk pregnancy complicated by: IUGR  (but baby was actually normal size) Delivery mode:  SVD Anticipated Birth Control:  OCPs, send rx at postpartum visit PP Procedures needed: none  Edinburgh: negative Schedule Integrated [place] visit: no  No relevant baby issues   Discharge home in stable condition Infant Feeding: Breast Infant Disposition:home with mother Discharge instruction: per After Visit Summary and Postpartum booklet. Activity: Advance as tolerated. Pelvic rest for 6 weeks.  Diet: routine diet Anticipated Birth Control: POPs Postpartum Appointment:4 weeks Additional Postpartum F/U: none Future Appointments:No future appointments. Follow up Visit:    01/13/2021 Shary Key, DO   CNM attestation I have seen and examined this patient and agree with above documentation in the resident's note.   Lisa Heath is a 30 y.o. G2P2002 s/p vag del.   Pain is well controlled.  Plan for birth control is oral contraceptives (estrogen/progesterone).  Method of Feeding: bottle  PE:  BP (!) 99/55 (BP Location: Left Arm)   Pulse 64   Temp 97.8 F (36.6 C) (Oral)   Resp 18   LMP  (LMP Unknown)   SpO2 100%   Breastfeeding Unknown  Fundus firm  Recent Labs    01/11/21 0927 01/11/21 2110  HGB 11.7* 12.1  HCT 34.6* 33.7*     Plan: discharge today - postpartum care discussed - f/u clinic in 4 weeks for postpartum visit   Myrtis Ser, CNM 9:03 PM   01/13/2021

## 2021-01-13 NOTE — Discharge Instructions (Signed)
-take tylenol 1000 mg every 6 hours as needed for pain, alternate with ibuprofen 600 mg every 6 hours -take iron pills every other day with vitamin c, this will help healing -think about birth control options-->bedisider.org is a great website!    Postpartum Care After Vaginal Delivery The following information offers guidance about how to care for yourself from the time you deliver your baby to 6-12 weeks after delivery (postpartum period). If you have problems or questions, contact your health care provider for more specific instructions. Follow these instructions at home: Vaginal bleeding  It is normal to have vaginal bleeding (lochia) after delivery. Wear a sanitary pad for bleeding and discharge. ? During the first week after delivery, the amount and appearance of lochia is often similar to a menstrual period. ? Over the next few weeks, it will gradually decrease to a dry, yellow-brown discharge. ? For most women, lochia stops completely by 4-6 weeks after delivery, but can vary.  Change your sanitary pads frequently. Watch for any changes in your flow, such as: ? A sudden increase in volume. ? A change in color. ? Large blood clots.  If you pass a blood clot from your vagina, save it and call your health care provider. Do not flush blood clots down the toilet before talking with your health care provider.  Do not use tampons or douches until your health care provider approves.  If you are not breastfeeding, your period should return 6-8 weeks after delivery. If you are feeding your baby breast milk only, your period may not return until you stop breastfeeding. Perineal care  Keep the area between the vagina and the anus (perineum) clean and dry. Use medicated pads and pain-relieving sprays and creams as directed.  If you had a surgical cut in the perineum (episiotomy) or a tear, check the area for signs of infection until you are healed. Check for: ? More redness, swelling, or  pain. ? Fluid or blood coming from the cut or tear. ? Warmth. ? Pus or a bad smell.  You may be given a squirt bottle to use instead of wiping to clean the perineum area after you use the bathroom. Pat the area gently to dry it.  To relieve pain caused by an episiotomy, a tear, or swollen veins in the anus (hemorrhoids), take a warm sitz bath 2-3 times a day. In a sitz bath, the warm water should only come up to your hips and cover your buttocks.   Breast care  In the first few days after delivery, your breasts may feel heavy, full, and uncomfortable (breast engorgement). Milk may also leak from your breasts. Ask your health care provider about ways to help relieve the discomfort.  If you are breastfeeding: ? Wear a bra that supports your breasts and fits well. Use breast pads to absorb milk that leaks. ? Keep your nipples clean and dry. Apply creams and ointments as told. ? You may have uterine contractions every time you breastfeed for up to several weeks after delivery. This helps your uterus return to its normal size. ? If you have any problems with breastfeeding, notify your health care provider or lactation consultant.  If you are not breastfeeding: ? Avoid touching your breasts. Do not squeeze out (express) milk. Doing this can make your breasts produce more milk. ? Wear a good-fitting bra and use cold packs to help with swelling. Intimacy and sexuality  Ask your health care provider when you can engage in sexual activity.  This may depend upon: ? Your risk of infection. ? How fast you are healing. ? Your comfort and desire to engage in sexual activity.  You are able to get pregnant after delivery, even if you have not had your period. Talk with your health care provider about methods of birth control (contraception) or family planning if you desire future pregnancies. Medicines  Take over-the-counter and prescription medicines only as told by your health care provider.  Take  an over-the-counter stool softener to help ease bowel movements as told by your health care provider.  If you were prescribed an antibiotic medicine, take it as told by your health care provider. Do not stop taking the antibiotic even if you start to feel better.  Review all previous and current prescriptions to check for possible transfer into breast milk. Activity  Gradually return to your normal activities as told by your health care provider.  Rest as much as possible. Nap while your baby is sleeping. Eating and drinking  Drink enough fluid to keep your urine pale yellow.  To help prevent or relieve constipation, eat high-fiber foods every day.  Choose healthy eating to support breastfeeding or weight loss goals.  Take your prenatal vitamins until your health care provider tells you to stop.   General tips/recommendations  Do not use any products that contain nicotine or tobacco. These products include cigarettes, chewing tobacco, and vaping devices, such as e-cigarettes. If you need help quitting, ask your health care provider.  Do not drink alcohol, especially if you are breastfeeding.  Do not take medications or drugs that are not prescribed to you, especially if you are breastfeeding.  Visit your health care provider for a postpartum checkup within the first 3-6 weeks after delivery.  Complete a comprehensive postpartum visit no later than 12 weeks after delivery.  Keep all follow-up visits for you and your baby. Contact a health care provider if:  You feel unusually sad or worried.  Your breasts become red, painful, or hard.  You have a fever or other signs of an infection.  You have bleeding that is soaking through one pad an hour or you have blood clots.  You have a severe headache that doesn't go away or you have vision changes.  You have nausea and vomiting and are unable to eat or drink anything for 24 hours. Get help right away if:  You have chest pain or  difficulty breathing.  You have sudden, severe leg pain.  You faint or have a seizure.  You have thoughts about hurting yourself or your baby. If you ever feel like you may hurt yourself or others, or have thoughts about taking your own life, get help right away. Go to your nearest emergency department or:  Call your local emergency services (911 in the [place]  The National Suicide Prevention Lifeline at 519 004 1594. This suicide crisis helpline is open 24 hours a day.  Text the Crisis Text Line at 575 200 8559 (in the [place] Summary  The period of time after you deliver your newborn up to 6-12 weeks after delivery is called the postpartum period.  Keep all follow-up visits for you and your baby.  Review all previous and current prescriptions to check for possible transfer into breast milk.  Contact a health care provider if you feel unusually sad or worried during the postpartum period. This information is not intended to replace advice given to you by your health care provider. Make sure you discuss any questions you have with  your health care provider. Document Revised: 08/12/2020 Document Reviewed: 08/12/2020 Elsevier Patient Education  2021 [employer].

## 2021-01-25 ENCOUNTER — Inpatient Hospital Stay (HOSPITAL_COMMUNITY): Payer: Medicaid Other

## 2021-01-25 ENCOUNTER — Encounter (HOSPITAL_COMMUNITY): Payer: Self-pay | Admitting: Emergency Medicine

## 2021-01-25 ENCOUNTER — Inpatient Hospital Stay (HOSPITAL_COMMUNITY): Payer: Medicaid Other | Admitting: Certified Registered"

## 2021-01-25 ENCOUNTER — Other Ambulatory Visit: Payer: Self-pay

## 2021-01-25 ENCOUNTER — Encounter (HOSPITAL_COMMUNITY): Admission: AD | Disposition: A | Discharge status: Home / Self Care | Payer: Self-pay | Source: Home / Self Care

## 2021-01-25 ENCOUNTER — Observation Stay (HOSPITAL_COMMUNITY)
Admission: AD | Admit: 2021-01-25 | Discharge: 2021-01-26 | Disposition: A | Discharge status: Home / Self Care | Payer: Medicaid Other | Attending: Obstetrics & Gynecology | Admitting: Obstetrics & Gynecology

## 2021-01-25 DIAGNOSIS — Z8616 Personal history of COVID-19: Secondary | ICD-10-CM | POA: Diagnosis not present

## 2021-01-25 DIAGNOSIS — N939 Abnormal uterine and vaginal bleeding, unspecified: Secondary | ICD-10-CM

## 2021-01-25 HISTORY — PX: DILATION AND EVACUATION: SHX1459

## 2021-01-25 LAB — POCT I-STAT, CHEM 8
BUN: 9 mg/dL (ref 6–20)
Calcium, Ion: 1.21 mmol/L (ref 1.15–1.40)
Chloride: 105 mmol/L (ref 98–111)
Creatinine, Ser: 0.6 mg/dL (ref 0.44–1.00)
Glucose, Bld: 124 mg/dL — ABNORMAL HIGH (ref 70–99)
HCT: 26 % — ABNORMAL LOW (ref 36.0–46.0)
Hemoglobin: 8.8 g/dL — ABNORMAL LOW (ref 12.0–15.0)
Potassium: 3.7 mmol/L (ref 3.5–5.1)
Sodium: 140 mmol/L (ref 135–145)
TCO2: 23 mmol/L (ref 22–32)

## 2021-01-25 LAB — PREPARE RBC (CROSSMATCH)

## 2021-01-25 LAB — CBC
HCT: 33.1 % — ABNORMAL LOW (ref 36.0–46.0)
Hemoglobin: 11.2 g/dL — ABNORMAL LOW (ref 12.0–15.0)
MCH: 29.5 pg (ref 26.0–34.0)
MCHC: 33.8 g/dL (ref 30.0–36.0)
MCV: 87.1 fL (ref 80.0–100.0)
Platelets: 266 10*3/uL (ref 150–400)
RBC: 3.8 MIL/uL — ABNORMAL LOW (ref 3.87–5.11)
RDW: 12.6 % (ref 11.5–15.5)
WBC: 6.9 10*3/uL (ref 4.0–10.5)
nRBC: 0 % (ref 0.0–0.2)

## 2021-01-25 SURGERY — DILATION AND EVACUATION, UTERUS
Anesthesia: General | Site: Uterus

## 2021-01-25 MED ORDER — SIMETHICONE 80 MG PO CHEW: 80.0000 mg | CHEWABLE_TABLET | Freq: Four times a day (QID) | ORAL | Status: DC | PRN

## 2021-01-25 MED ORDER — BUPIVACAINE HCL 0.5 % IJ SOLN
INTRAMUSCULAR | Status: DC | PRN
  Administered 2021-01-25: 30 mL

## 2021-01-25 MED ORDER — PANTOPRAZOLE SODIUM 40 MG PO TBEC
40.0000 mg | DELAYED_RELEASE_TABLET | Freq: Every day | ORAL | Status: DC
  Administered 2021-01-26: 40 mg via ORAL
  Filled 2021-01-25: qty 1

## 2021-01-25 MED ORDER — MIDAZOLAM HCL 5 MG/5ML IJ SOLN
INTRAMUSCULAR | Status: DC | PRN
  Administered 2021-01-25: 2 mg via INTRAVENOUS

## 2021-01-25 MED ORDER — DOXYCYCLINE HYCLATE 100 MG IV SOLR
200.0000 mg | INTRAVENOUS | Status: AC
  Administered 2021-01-25: 200 mg via INTRAVENOUS
  Filled 2021-01-25 (×2): qty 200

## 2021-01-25 MED ORDER — BUPIVACAINE HCL (PF) 0.5 % IJ SOLN
INTRAMUSCULAR | Status: AC
  Filled 2021-01-25: qty 30

## 2021-01-25 MED ORDER — TRANEXAMIC ACID-NACL 1000-0.7 MG/100ML-% IV SOLN
INTRAVENOUS | Status: AC
  Filled 2021-01-25: qty 100

## 2021-01-25 MED ORDER — LACTATED RINGERS IV BOLUS
1000.0000 mL | Freq: Once | INTRAVENOUS | Status: AC
  Administered 2021-01-25: 1000 mL via INTRAVENOUS

## 2021-01-25 MED ORDER — DOCUSATE SODIUM 100 MG PO CAPS
100.0000 mg | ORAL_CAPSULE | Freq: Two times a day (BID) | ORAL | Status: DC
  Administered 2021-01-26: 100 mg via ORAL
  Filled 2021-01-25 (×2): qty 1

## 2021-01-25 MED ORDER — MAGNESIUM CITRATE PO SOLN
1.0000 | Freq: Once | ORAL | Status: DC | PRN
  Filled 2021-01-25: qty 296

## 2021-01-25 MED ORDER — ACETAMINOPHEN 10 MG/ML IV SOLN: 1000.0000 mg | Freq: Once | INTRAVENOUS | Status: DC | PRN

## 2021-01-25 MED ORDER — ONDANSETRON HCL 4 MG PO TABS: 4.0000 mg | ORAL_TABLET | Freq: Four times a day (QID) | ORAL | Status: DC | PRN

## 2021-01-25 MED ORDER — ONDANSETRON HCL 4 MG/2ML IJ SOLN
INTRAMUSCULAR | Status: AC
  Filled 2021-01-25: qty 2

## 2021-01-25 MED ORDER — ACETAMINOPHEN 500 MG PO TABS
1000.0000 mg | ORAL_TABLET | Freq: Four times a day (QID) | ORAL | Status: DC
  Administered 2021-01-26 (×3): 1000 mg via ORAL
  Filled 2021-01-25 (×3): qty 2

## 2021-01-25 MED ORDER — LIDOCAINE 2% (20 MG/ML) 5 ML SYRINGE
INTRAMUSCULAR | Status: DC | PRN
  Administered 2021-01-25: 60 mg via INTRAVENOUS

## 2021-01-25 MED ORDER — OXYCODONE-ACETAMINOPHEN 5-325 MG PO TABS: 2.0000 | ORAL_TABLET | ORAL | Status: DC | PRN

## 2021-01-25 MED ORDER — 0.9 % SODIUM CHLORIDE (POUR BTL) OPTIME
TOPICAL | Status: DC | PRN
  Administered 2021-01-25: 1000 mL

## 2021-01-25 MED ORDER — PROPOFOL 10 MG/ML IV BOLUS
INTRAVENOUS | Status: DC | PRN
  Administered 2021-01-25: 120 mg via INTRAVENOUS
  Administered 2021-01-25: 30 mg via INTRAVENOUS

## 2021-01-25 MED ORDER — METHYLERGONOVINE MALEATE 0.2 MG PO TABS
0.2000 mg | ORAL_TABLET | Freq: Three times a day (TID) | ORAL | Status: DC
  Administered 2021-01-26 (×2): 0.2 mg via ORAL
  Filled 2021-01-25 (×2): qty 1

## 2021-01-25 MED ORDER — MISOPROSTOL 200 MCG PO TABS: 800.0000 ug | ORAL_TABLET | Freq: Once | ORAL | Status: AC

## 2021-01-25 MED ORDER — METHYLERGONOVINE MALEATE 0.2 MG/ML IJ SOLN: 0.2000 mg | Freq: Once | INTRAMUSCULAR | Status: AC

## 2021-01-25 MED ORDER — CHLORHEXIDINE GLUCONATE 0.12 % MT SOLN
15.0000 mL | Freq: Once | OROMUCOSAL | Status: AC
  Administered 2021-01-25: 15 mL via OROMUCOSAL
  Filled 2021-01-25: qty 15

## 2021-01-25 MED ORDER — OXYCODONE HCL 5 MG PO TABS
5.0000 mg | ORAL_TABLET | ORAL | Status: DC | PRN
  Administered 2021-01-25: 5 mg via ORAL
  Filled 2021-01-25: qty 1

## 2021-01-25 MED ORDER — PHENYLEPHRINE 40 MCG/ML (10ML) SYRINGE FOR IV PUSH (FOR BLOOD PRESSURE SUPPORT)
PREFILLED_SYRINGE | INTRAVENOUS | Status: AC
  Filled 2021-01-25: qty 10

## 2021-01-25 MED ORDER — FENTANYL CITRATE (PF) 250 MCG/5ML IJ SOLN
INTRAMUSCULAR | Status: AC
  Filled 2021-01-25: qty 5

## 2021-01-25 MED ORDER — LACTATED RINGERS IV SOLN: INTRAVENOUS | Status: DC

## 2021-01-25 MED ORDER — METHYLERGONOVINE MALEATE 0.2 MG/ML IJ SOLN
INTRAMUSCULAR | Status: AC
  Filled 2021-01-25: qty 1

## 2021-01-25 MED ORDER — SENNOSIDES-DOCUSATE SODIUM 8.6-50 MG PO TABS: 1.0000 | ORAL_TABLET | Freq: Every evening | ORAL | Status: DC | PRN

## 2021-01-25 MED ORDER — KETOROLAC TROMETHAMINE 30 MG/ML IJ SOLN: 30.0000 mg | Freq: Once | INTRAMUSCULAR | Status: DC

## 2021-01-25 MED ORDER — TRANEXAMIC ACID 1000 MG/10ML IV SOLN
INTRAVENOUS | Status: DC | PRN
  Administered 2021-01-25: 1000 mg via INTRAVENOUS

## 2021-01-25 MED ORDER — DEXAMETHASONE SODIUM PHOSPHATE 4 MG/ML IJ SOLN
INTRAMUSCULAR | Status: DC | PRN
  Administered 2021-01-25: 5 mg via INTRAVENOUS

## 2021-01-25 MED ORDER — LIDOCAINE 2% (20 MG/ML) 5 ML SYRINGE
INTRAMUSCULAR | Status: AC
  Filled 2021-01-25: qty 5

## 2021-01-25 MED ORDER — ONDANSETRON HCL 4 MG/2ML IJ SOLN: 4.0000 mg | Freq: Once | INTRAMUSCULAR | Status: DC | PRN

## 2021-01-25 MED ORDER — ALUM & MAG HYDROXIDE-SIMETH 200-200-20 MG/5ML PO SUSP
30.0000 mL | ORAL | Status: DC | PRN
  Administered 2021-01-26: 30 mL via ORAL
  Filled 2021-01-25: qty 30

## 2021-01-25 MED ORDER — ONDANSETRON HCL 4 MG/2ML IJ SOLN: 4.0000 mg | Freq: Four times a day (QID) | INTRAMUSCULAR | Status: DC | PRN

## 2021-01-25 MED ORDER — METHYLERGONOVINE MALEATE 0.2 MG/ML IJ SOLN
INTRAMUSCULAR | Status: DC | PRN
  Administered 2021-01-25: .2 mg via INTRAMUSCULAR

## 2021-01-25 MED ORDER — FENTANYL CITRATE (PF) 100 MCG/2ML IJ SOLN
INTRAMUSCULAR | Status: DC | PRN
  Administered 2021-01-25 (×2): 25 ug via INTRAVENOUS

## 2021-01-25 MED ORDER — METHYLERGONOVINE MALEATE 0.2 MG/ML IJ SOLN
INTRAMUSCULAR | Status: AC
  Administered 2021-01-25: 0.2 mg via INTRAMUSCULAR
  Filled 2021-01-25: qty 1

## 2021-01-25 MED ORDER — MIDAZOLAM HCL 2 MG/2ML IJ SOLN
INTRAMUSCULAR | Status: AC
  Filled 2021-01-25: qty 2

## 2021-01-25 MED ORDER — ZOLPIDEM TARTRATE 5 MG PO TABS: 5.0000 mg | ORAL_TABLET | Freq: Every evening | ORAL | Status: DC | PRN

## 2021-01-25 MED ORDER — HYDROMORPHONE HCL 1 MG/ML IJ SOLN: 0.2500 mg | INTRAMUSCULAR | Status: DC | PRN

## 2021-01-25 MED ORDER — BISACODYL 10 MG RE SUPP: 10.0000 mg | Freq: Every day | RECTAL | Status: DC | PRN

## 2021-01-25 MED ORDER — FERROUS SULFATE 325 (65 FE) MG PO TABS
325.0000 mg | ORAL_TABLET | ORAL | Status: DC
Start: 2021-01-26 — End: 2021-01-26
  Administered 2021-01-26: 325 mg via ORAL
  Filled 2021-01-25: qty 1

## 2021-01-25 MED ORDER — IBUPROFEN 800 MG PO TABS
800.0000 mg | ORAL_TABLET | Freq: Three times a day (TID) | ORAL | Status: DC
  Administered 2021-01-25 – 2021-01-26 (×2): 800 mg via ORAL
  Filled 2021-01-25 (×2): qty 1

## 2021-01-25 MED ORDER — ONDANSETRON HCL 4 MG/2ML IJ SOLN
INTRAMUSCULAR | Status: DC | PRN
  Administered 2021-01-25: 4 mg via INTRAVENOUS

## 2021-01-25 MED ORDER — OXYCODONE HCL 5 MG PO TABS: 5.0000 mg | ORAL_TABLET | Freq: Once | ORAL | Status: DC | PRN

## 2021-01-25 MED ORDER — DEXAMETHASONE SODIUM PHOSPHATE 10 MG/ML IJ SOLN
INTRAMUSCULAR | Status: AC
  Filled 2021-01-25: qty 1

## 2021-01-25 MED ORDER — PHENYLEPHRINE 40 MCG/ML (10ML) SYRINGE FOR IV PUSH (FOR BLOOD PRESSURE SUPPORT)
PREFILLED_SYRINGE | INTRAVENOUS | Status: DC | PRN
  Administered 2021-01-25: 80 ug via INTRAVENOUS

## 2021-01-25 MED ORDER — OXYCODONE HCL 5 MG/5ML PO SOLN: 5.0000 mg | Freq: Once | ORAL | Status: DC | PRN

## 2021-01-25 MED ORDER — MISOPROSTOL 200 MCG PO TABS
ORAL_TABLET | ORAL | Status: AC
  Administered 2021-01-25: 800 ug via RECTAL
  Filled 2021-01-25: qty 4

## 2021-01-25 MED ORDER — MENTHOL 3 MG MT LOZG: 1.0000 | LOZENGE | OROMUCOSAL | Status: DC | PRN

## 2021-01-25 MED ORDER — ALBUMIN HUMAN 5 % IV SOLN: INTRAVENOUS | Status: DC | PRN

## 2021-01-25 MED ORDER — EPHEDRINE SULFATE-NACL 50-0.9 MG/10ML-% IV SOSY
PREFILLED_SYRINGE | INTRAVENOUS | Status: DC | PRN
  Administered 2021-01-25 (×2): 5 mg via INTRAVENOUS

## 2021-01-25 SURGICAL SUPPLY — 28 items
BALLN POSTPARTUM SOS BAKRI (BALLOONS) ×2
BALLOON POSTPARTUM SOS BAKRI (BALLOONS) ×1 IMPLANT
CATH ROBINSON RED A/P 16FR (CATHETERS) ×2 IMPLANT
DECANTER SPIKE VIAL GLASS SM (MISCELLANEOUS) ×2 IMPLANT
FILTER UTR ASPR ASSEMBLY (MISCELLANEOUS) ×2 IMPLANT
GAUZE 4X4 16PLY RFD (DISPOSABLE) ×2 IMPLANT
GLOVE ECLIPSE 7.0 STRL STRAW (GLOVE) ×2 IMPLANT
GLOVE SURG UNDER POLY LF SZ7 (GLOVE) ×2 IMPLANT
GOWN STRL REUS W/ TWL LRG LVL3 (GOWN DISPOSABLE) ×3 IMPLANT
GOWN STRL REUS W/TWL LRG LVL3 (GOWN DISPOSABLE) ×3
HOSE CONNECTING 18IN BERKELEY (TUBING) ×2 IMPLANT
KIT BERKELEY 1ST TRI 3/8 NO TR (MISCELLANEOUS) ×4 IMPLANT
KIT BERKELEY 1ST TRIMESTER 3/8 (MISCELLANEOUS) ×4 IMPLANT
NS IRRIG 1000ML POUR BTL (IV SOLUTION) ×2 IMPLANT
PACK VAGINAL MINOR WOMEN LF (CUSTOM PROCEDURE TRAY) ×2 IMPLANT
PAD OB MATERNITY 4.3X12.25 (PERSONAL CARE ITEMS) ×2 IMPLANT
SET BERKELEY SUCTION TUBING (SUCTIONS) ×2 IMPLANT
SPONGE LAP 4X18 RFD (DISPOSABLE) ×2 IMPLANT
SUT VIC AB 2-0 CT1 27 (SUTURE) ×1
SUT VIC AB 2-0 CT1 TAPERPNT 27 (SUTURE) ×1 IMPLANT
TOWEL GREEN STERILE FF (TOWEL DISPOSABLE) ×4 IMPLANT
TRAY FOLEY MTR SLVR 16FR STAT (SET/KITS/TRAYS/PACK) ×2 IMPLANT
UNDERPAD 30X36 HEAVY ABSORB (UNDERPADS AND DIAPERS) ×4 IMPLANT
VACURETTE 10 RIGID CVD (CANNULA) ×2 IMPLANT
VACURETTE 6 ASPIR F TIP BERK (CANNULA) IMPLANT
VACURETTE 7MM CVD STRL WRAP (CANNULA) IMPLANT
VACURETTE 8 RIGID CVD (CANNULA) IMPLANT
VACURETTE 9 RIGID CVD (CANNULA) IMPLANT

## 2021-01-25 NOTE — H&P (Signed)
Preoperative History and Physical  Lisa Heath is a 30 y.o. [id number] here for surgical management of postpartum hemorrhage and concern about possible retained of products, had vaginal delivery on 01/12/2021.  Upon evaluation in MAU, she was treated with various uterotonics but continued to bleed.  EBL 634 ml.  Ultrasound showed thickened endometrial strip at 71m, possible retained products.  Given continued bleeding, surgical intervention was needed.  Of note, she had positive test for COVID on 01/11/2021 during previous admission, asymptomatic.   No other significant preoperative concerns.  Patient is Arabic-speaking only, [employer] Arabic interpreter used for this encounter.   Proposed surgery: Dilation and Evacuation  Past Medical History:  Diagnosis Date  . Allergic reaction 02/18/2020  . Angioedema 02/18/2020  . COVID-19 01/11/2021  . GBS (group B Streptococcus carrier), +RV culture, currently pregnant 04/03/2019  . Left breast mass   . Other allergic rhinitis 02/18/2020  . Vaginal Pap smear, abnormal   . Venereal warts    Past Surgical History:  Procedure Laterality Date  . COLPOSCOPY     OB History  Gravida Para Term Preterm AB Living  '2 2 2     2  ' SAB IAB Ectopic Multiple Live Births        0 2    # Outcome Date GA Lbr Len/2nd Weight Sex Delivery Anes PTL Lv  2 Term 01/12/21 46w6d2:00 / 00:25 3730 g F Vag-Spont EPI  LIV  1 Term 04/04/19 [id number] 3595 g M Vag-Vacuum EPI  LIV     Birth Comments: caput, +GBS, benign breast lump, pp hemorrhage  Patient denies any other pertinent gynecologic issues.   No current facility-administered medications on file prior to encounter.   Current Outpatient Medications on File Prior to Encounter  Medication Sig Dispense Refill  . ibuprofen (ADVIL) 600 MG tablet Take 1 tablet (600 mg total) by mouth every 6 (six) hours as needed. 30 tablet 0  . acetaminophen (TYLENOL) 500 MG tablet Take 2 tablets (1,000 mg total) by mouth every 6 (six)  hours as needed (for pain scale < 4).    . Blood Pressure Monitoring (BLOOD PRESSURE KIT) DEVI 1 Device by Does not apply route daily. ICD 10: Z34.00 1 each 0  . ferrous sulfate 325 (65 FE) MG tablet Take 1 tablet (325 mg total) by mouth every other day.  3  . vitamin C (VITAMIN C) 250 MG tablet Take 1 tablet (250 mg total) by mouth every other day.     No Known Allergies  Social History:   reports that she has never smoked. She has never used smokeless tobacco. She reports that she does not drink alcohol and does not use drugs.  Family History  Problem Relation Age of Onset  . Asthma Neg Hx   . Allergic rhinitis Neg Hx   . Eczema Neg Hx   . Urticaria Neg Hx   . Immunodeficiency Neg Hx   . Angioedema Neg Hx     Review of Systems: Pertinent items noted in HPI and remainder of comprehensive ROS otherwise negative.  PHYSICAL EXAM: Blood pressure 112/75, pulse 72, temperature 98.6 F (37 C), resp. rate 15, weight 55.1 kg, SpO2 100 %, unknown if currently breastfeeding. CONSTITUTIONAL: Well-developed, well-nourished female in no acute distress.  HENT:  Normocephalic, atraumatic, External right and left ear normal.  EYES: Conjunctivae and EOM are normal. Pupils are equal, round, and reactive to light. No scleral icterus.  NECK: Normal range of motion, supple, no masses SKIN:  Skin is warm and dry. No rash noted. Not diaphoretic. No erythema. No pallor. NEUROLOGIC: Alert and oriented to person, place, and time. Normal reflexes, muscle tone coordination. No cranial nerve deficit noted. PSYCHIATRIC: Normal mood and affect. Normal behavior. Normal judgment and thought content. CARDIOVASCULAR: Normal heart rate noted, regular rhythm RESPIRATORY: Effort and breath sounds normal, no problems with respiration noted ABDOMEN: Soft, nontender, nondistended. PELVIC: Moderate blood noted on pad MUSCULOSKELETAL: Normal range of motion. No edema and no tenderness. 2+ distal pulses.  Labs: Results for  orders placed or performed during the hospital encounter of 01/25/21 (from the past 336 hour(s))  CBC   Collection Time: 01/25/21  3:14 PM  Result Value Ref Range   WBC 6.9 4.0 - 10.5 K/uL   RBC 3.80 (L) 3.87 - 5.11 MIL/uL   Hemoglobin 11.2 (L) 12.0 - 15.0 g/dL   HCT 33.1 (L) 36.0 - 46.0 %   MCV 87.1 80.0 - 100.0 fL   MCH 29.5 26.0 - 34.0 pg   MCHC 33.8 30.0 - 36.0 g/dL   RDW 12.6 11.5 - 15.5 %   Platelets 266 150 - 400 K/uL   nRBC 0.0 0.0 - 0.2 %  Type and screen [place]   Collection Time: 01/25/21  3:14 PM  Result Value Ref Range   ABO/RH(D) O POS    Antibody Screen NEG    Sample Expiration      01/28/2021,2359 Performed at [facility], [place] [address] [place], [place] [address]   Results for orders placed or performed during the hospital encounter of 01/11/21 (from the past 336 hour(s))  CBC   Collection Time: 01/11/21  9:10 PM  Result Value Ref Range   WBC 9.2 4.0 - 10.5 K/uL   RBC 3.84 (L) 3.87 - 5.11 MIL/uL   Hemoglobin 12.1 12.0 - 15.0 g/dL   HCT 33.7 (L) 36.0 - 46.0 %   MCV 87.8 80.0 - 100.0 fL   MCH 31.5 26.0 - 34.0 pg   MCHC 35.9 30.0 - 36.0 g/dL   RDW 13.5 11.5 - 15.5 %   Platelets 145 (L) 150 - 400 K/uL   nRBC 0.0 0.0 - 0.2 %    Imaging Studies: US PELVIS (TRANSABDOMINAL ONLY)  Result Date: 01/25/2021 CLINICAL DATA:  Excessive vaginal bleeding after delivery. EXAM: TRANSABDOMINAL ULTRASOUND OF PELVIS TECHNIQUE: Transabdominal ultrasound examination of the pelvis was performed including evaluation of the uterus, ovaries, adnexal regions, and pelvic cul-de-sac. COMPARISON:  None. FINDINGS: Uterus Measurements: 12 x 8.3 x 8.6 cm = volume: 443 mL. No fibroids or other mass visualized. Endometrium Thickness: 34 mm. The endometrium is thickened and heterogeneous. There is no evidence for increased color Doppler flow within the endometrium. Right ovary Measurements: 2.6 x 1.6 x 2.6 cm = volume: 5.6 mL. Normal appearance/no adnexal mass. Left  ovary Not visualized Other findings:  No abnormal free fluid. IMPRESSION: 1. Thickened heterogeneous endometrium without increased color Doppler flow. Findings are nonspecific and could indicate endometrial blood products although hypovascular retained products of conception cannot be fully excluded. Consider short-term follow-up pelvic ultrasound or further evaluation with pelvic MRI without and with IV contrast, as clinically warranted. 2. Unremarkable appearance of the right ovary. Nonvisualization of the left ovary. Electronically Signed   By: Constance Holster M.D.   On: 01/25/2021 16:04   Assessment: Principal Problem:   PPH (postpartum hemorrhage) Active Problems:   Retained products of conception, following delivery with hemorrhage  Plan: Patient will undergo surgical management with  Dilation and Evacuation.   Risks of surgery including bleeding which may require transfusion, infection, injury to surrounding organs, need for additional procedures, possibility of intrauterine scarring which may impair future fertility, risk of retained products which may require further management and other postoperative/anesthesia complications were explained to patient.  Likelihood of success of complete evacuation of the uterus was discussed with the patient.  Written informed consent was obtained.  Patient has been NPO since 0900,  she will remain NPO for procedure. Anesthesia and OR aware.  Preoperative prophylactic Doxycycline 279m IV  has been ordered and is on call to the OR.  To OR when ready.    If patient is stable after procedure, she may be discharged to home but if there is further blood loss or other concerns, she may be monitored overnight.     UVerita Schneiders MD, FDoolingfor [employer] [facility]

## 2021-01-25 NOTE — MAU Note (Signed)
Patient passed another large clot, NP notified

## 2021-01-25 NOTE — MAU Note (Signed)
Patient here with heavier vaginal bleeding that started this morning.  States her bleeding has tapered down since delivery and then got much worse today.  Also reports her cramping has gotten worse.

## 2021-01-25 NOTE — Progress Notes (Signed)
Spoke to infection prevention about 21 day post COVID isolation flag. Per Clydie Braun with infection prevention patient does not need isolation for surgery and post surgery. She will remove flag.

## 2021-01-25 NOTE — Op Note (Signed)
Lisa Heath PROCEDURE DATE:  01/25/2021  PREOPERATIVE DIAGNOSIS: Delayed postpartum hemorrhage, concern about retained products of conception POSTOPERATIVE DIAGNOSIS: The same, uterine atony PROCEDURE:  Dilation and Evacuation, Bakri Balloon placement SURGEON:  Dr. Jaynie Collins  INDICATIONS: 30 y.o. [id number] with delayed postpartum hemorrhage after SVD on 01/12/2021, needing surgical completion.  Risks of surgery were discussed with the patient and her husband using [employer] Arabic interpreter including but not limited to: bleeding which may require transfusion, infection, injury to surrounding organs, need for additional procedures, possibility of intrauterine scarring which may impair future fertility, risk of retained products which may require further management and other postoperative/anesthesia complications were explained to patient. Written informed consent was obtained.  FINDINGS:  Atonic uterus with significant clots and blood.  Total EBL including blood loss in MAU was 1.5 liters.  Received IM Methergine and TXA 1g IV without any amelioration of atony. Bakri balloon placed, balloon inflated with 200 ml of saline.  i-STAT Hemoglobin in OR was 8.8 down from 11.2, patient ordered for 2 units pRBCs.  ANESTHESIA:    Epidural, paracervical block with 30 ml of 0.5% Marcaine INTRAVENOUS FLUIDS:  of LR, 250 ml of albumin and ordered for 2 units or pRBCs ESTIMATED BLOOD LOSS:  800 ml in OR (about 700 ml in MAU prior to surgery) SPECIMENS: Retained products of conception sent to pathology COMPLICATIONS:  None immediate.  PROCEDURE DETAILS:  The patient was then taken to the operating room where general anesthesia was administered and was found to be adequate.  After an adequate timeout was performed, she was placed in the dorsal lithotomy position and examined; then prepped and draped in the sterile manner.   A vaginal speculum was then placed in the patient's vagina and large clots were  evacuated from the vagina and cervical os.  Ring forceps was applied to the anterior lip of the cervix, and the paracervical block was placed.  The cervix was already about 1 cm dilated.  A 10 mm suction curette was then advanced into the uterus, the suction device was then activated and curette slowly rotated to clear further large clots, bright red blood and some products of conception.  A sharp curettage was then performed to confirm complete emptying of the uterus. There was significant bleeding noted so IM Methergine and TXA 1g IV was administered. Fundal massage was administered. This did not help with bleeding. Given increased blood loss, decision was made to place Bakri balloon for tamponade.  This was placed and inflated with 200 ml of saline.   The bleeding was noted to subside significantly.  The Bakri was attached to a urometer bag.  Of note, there was superficial separation of her healing perineal laceration, this was reapproximated with figure of eight 3-0 Vicryl suture.  All instruments were then removed from the patient's vagina.  Sponge and instrument counts were correct times three.  The patient tolerated the procedure well and was taken to the recovery area awake and in stable condition.  Given the amount of blood loss and Bakri placement, patient will be observed overnight in the hospital and will receive blood transfusions as indicated.  She will be discharged in the morning if she remains stable.      Jaynie Collins, MD, FACOG Obstetrician & Gynecologist, [facility] for [employer], [facility]

## 2021-01-25 NOTE — ED Notes (Signed)
Josh, PA completed MSE at triage and report called to Tobi Bastos in MAU.

## 2021-01-25 NOTE — OR Nursing (Signed)
200 cc of sterile water placed in bakri balloon.

## 2021-01-25 NOTE — ED Triage Notes (Signed)
Emergency Medicine Provider OB Triage Evaluation Note  Lisa Heath is a 30 y.o. female, G2P2002, who presents to the emergency department with complaints of vaginal bleeding.  She reports development of pelvic pain and vaginal bleeding this morning, increasing and passing a clot this afternoon. She gave birth on 01/12/2021.  She had a small perineal laceration that was repaired.  No episiotomy.  Review of  Systems  Positive: vaginal bleeding Negative: lightheaded  Physical Exam  BP 126/79 (BP Location: Left Arm)   Pulse 81   Temp 98 F (36.7 C)   Resp 16   LMP  (LMP Unknown)   SpO2 100%  General: Awake, no distress  HEENT: Atraumatic  Resp: Normal effort  Cardiac: Normal rate Abd: Nondistended, nontender  MSK: Moves all extremities without difficulty Neuro: Speech clear  Medical Decision Making  Pt evaluated for pregnancy concern and is stable for transfer to MAU. Pt is in agreement with plan for transfer.  2:12 PM Discussed with MAU APP, Joni Reining, who accepts patient in transfer.  Clinical Impression   1. Vaginal bleeding        Renne Crigler, PA-C 01/25/21 1414

## 2021-01-25 NOTE — Anesthesia Procedure Notes (Signed)
Procedure Name: LMA Insertion Date/Time: 01/25/2021 7:09 PM Performed by: Mayer Camel, CRNA Pre-anesthesia Checklist: Patient identified, Emergency Drugs available, Suction available and Patient being monitored Patient Re-evaluated:Patient Re-evaluated prior to induction Oxygen Delivery Method: Circle System Utilized Preoxygenation: Pre-oxygenation with 100% oxygen Induction Type: IV induction Ventilation: Mask ventilation without difficulty LMA: LMA inserted LMA Size: 3.0 Number of attempts: 1 Airway Equipment and Method: Bite block Placement Confirmation: positive ETCO2 Tube secured with: Tape Dental Injury: Teeth and Oropharynx as per pre-operative assessment

## 2021-01-25 NOTE — MAU Note (Signed)
Nugent, NP notified of large clots that patient was currently passing, currently at bedside to assess vaginal bleeding. Verbal orders given.

## 2021-01-25 NOTE — ED Triage Notes (Signed)
Pt reports vaginal bleeding and lower abd pain.  Vaginal delivery 2 weeks ago.

## 2021-01-25 NOTE — MAU Provider Note (Signed)
History     CSN: [id number]  Arrival date and time: 01/25/21 1349   None     Chief Complaint  Patient presents with  . Vaginal Bleeding   Lisa Heath is a 30 y.o. G2P2002 at ppartum who presents to MAU for increased vaginal bleeding. Patient reports she had a [place] after deliveyr 01/12/2021. Notes state patient lost 629m and was given TXA, cytotec and methergine. Patient last ate and drank at 9AM this morning. Patient had COVID while in hospital at delivery. Hgb prior to delivery was 12, but no CBC was drawn after delivery. Patient reports this morning around 10AM an increase in bleeding with clots and bright red bleeding. Patient originally presented to ED and was transferred to MAU with pad saturated with bright red blood.  Pt denies vaginal discharge/odor/itching. Pt denies N/V, abdominal pain, constipation, diarrhea, or urinary problems. Pt denies fever, chills, fatigue, sweating or changes in appetite. Pt denies SOB or chest pain. Pt denies dizziness, HA, light-headedness, weakness.   OB History    Gravida  2   Para  2   Term  2   Preterm      AB      Living  2     SAB      IAB      Ectopic      Multiple  0   Live Births  2           Past Medical History:  Diagnosis Date  . Allergic reaction 02/18/2020  . Angioedema 02/18/2020  . GBS (group B Streptococcus carrier), +RV culture, currently pregnant 04/03/2019  . Left breast mass   . Other allergic rhinitis 02/18/2020  . Vaginal Pap smear, abnormal   . Venereal warts     Past Surgical History:  Procedure Laterality Date  . COLPOSCOPY      Family History  Problem Relation Age of Onset  . Asthma Neg Hx   . Allergic rhinitis Neg Hx   . Eczema Neg Hx   . Urticaria Neg Hx   . Immunodeficiency Neg Hx   . Angioedema Neg Hx     Social History   Tobacco Use  . Smoking status: Never Smoker  . Smokeless tobacco: Never Used  Vaping Use  . Vaping Use: Never used  Substance Use Topics  .  Alcohol use: Never  . Drug use: Never    Allergies: No Known Allergies  Medications Prior to Admission  Medication Sig Dispense Refill Last Dose  . ibuprofen (ADVIL) 600 MG tablet Take 1 tablet (600 mg total) by mouth every 6 (six) hours as needed. 30 tablet 0 01/25/2021 at 0900  . acetaminophen (TYLENOL) 500 MG tablet Take 2 tablets (1,000 mg total) by mouth every 6 (six) hours as needed (for pain scale < 4).     . Blood Pressure Monitoring (BLOOD PRESSURE KIT) DEVI 1 Device by Does not apply route daily. ICD 10: Z34.00 1 each 0   . ferrous sulfate 325 (65 FE) MG tablet Take 1 tablet (325 mg total) by mouth every other day.  3   . vitamin C (VITAMIN C) 250 MG tablet Take 1 tablet (250 mg total) by mouth every other day.       Review of Systems  Constitutional: Negative for chills, diaphoresis, fatigue and fever.  Eyes: Negative for visual disturbance.  Respiratory: Negative for shortness of breath.   Cardiovascular: Negative for chest pain.  Gastrointestinal: Negative for abdominal pain, constipation, diarrhea, nausea and  vomiting.  Genitourinary: Positive for vaginal bleeding. Negative for dysuria, flank pain, frequency, pelvic pain, urgency and vaginal discharge.  Neurological: Negative for dizziness, weakness, light-headedness and headaches.   Physical Exam   Blood pressure 133/69, pulse 65, temperature 98.6 F (37 C), resp. rate 15, weight 55.1 kg, SpO2 100 %, unknown if currently breastfeeding.  Patient Vitals for the past 24 hrs:  BP Temp Pulse Resp SpO2 Weight  01/25/21 1701 133/69 - 65 - - -  01/25/21 1646 (!) 146/73 - 60 - - -  01/25/21 1641 (!) 147/59 - 61 - - -  01/25/21 1631 (!) 155/77 - 63 - - -  01/25/21 1616 (!) 154/74 - 60 - - -  01/25/21 1601 (!) 143/88 - 67 - - -  01/25/21 1546 (!) 143/86 - 63 - - -  01/25/21 1531 122/78 - 60 - - -  01/25/21 1516 105/74 - 66 - - -  01/25/21 1509 113/80 - 77 - - -  01/25/21 1448 110/80 98.6 F (37 C) 85 15 - 55.1 kg   01/25/21 1447 - - - 15 - -  01/25/21 1353 126/79 98 F (36.7 C) 81 16 100 % -   Physical Exam Vitals and nursing note reviewed.  Constitutional:      General: She is not in acute distress.    Appearance: Normal appearance. She is not ill-appearing, toxic-appearing or diaphoretic.  HENT:     Head: Normocephalic and atraumatic.  Pulmonary:     Effort: Pulmonary effort is normal.  Abdominal:     Palpations: Abdomen is soft.     Tenderness: There is no abdominal tenderness.     Comments: Fundus not palpable, large diastasis recti present.  Skin:    General: Skin is warm and dry.  Neurological:     Mental Status: She is alert and oriented to person, place, and time.  Psychiatric:        Mood and Affect: Mood normal.        Behavior: Behavior normal.        Thought Content: Thought content normal.        Judgment: Judgment normal.    Results for orders placed or performed during the hospital encounter of 01/25/21 (from the past 24 hour(s))  CBC     Status: Abnormal   Collection Time: 01/25/21  3:14 PM  Result Value Ref Range   WBC 6.9 4.0 - 10.5 K/uL   RBC 3.80 (L) 3.87 - 5.11 MIL/uL   Hemoglobin 11.2 (L) 12.0 - 15.0 g/dL   HCT 33.1 (L) 36.0 - 46.0 %   MCV 87.1 80.0 - 100.0 fL   MCH 29.5 26.0 - 34.0 pg   MCHC 33.8 30.0 - 36.0 g/dL   RDW 12.6 11.5 - 15.5 %   Platelets 266 150 - 400 K/uL   nRBC 0.0 0.0 - 0.2 %  Type and screen [place]     Status: None   Collection Time: 01/25/21  3:14 PM  Result Value Ref Range   ABO/RH(D) O POS    Antibody Screen NEG    Sample Expiration      01/28/2021,2359 Performed at [facility], [place] [address], [place], [place] [address]    US PELVIS (TRANSABDOMINAL ONLY)  Result Date: 01/25/2021 CLINICAL DATA:  Excessive vaginal bleeding after delivery. EXAM: TRANSABDOMINAL ULTRASOUND OF PELVIS TECHNIQUE: Transabdominal ultrasound examination of the pelvis was performed including evaluation of the uterus, ovaries,  adnexal regions, and pelvic cul-de-sac. COMPARISON:  None. FINDINGS: Uterus Measurements: 12 x 8.3 x 8.6 cm = volume: 443 mL. No fibroids or other mass visualized. Endometrium Thickness: 34 mm. The endometrium is thickened and heterogeneous. There is no evidence for increased color Doppler flow within the endometrium. Right ovary Measurements: 2.6 x 1.6 x 2.6 cm = volume: 5.6 mL. Normal appearance/no adnexal mass. Left ovary Not visualized Other findings:  No abnormal free fluid. IMPRESSION: 1. Thickened heterogeneous endometrium without increased color Doppler flow. Findings are nonspecific and could indicate endometrial blood products although hypovascular retained products of conception cannot be fully excluded. Consider short-term follow-up pelvic ultrasound or further evaluation with pelvic MRI without and with IV contrast, as clinically warranted. 2. Unremarkable appearance of the right ovary. Nonvisualization of the left ovary. Electronically Signed   By: Constance Holster M.D.   On: 01/25/2021 16:04   [place] MFM OB FOLLOW UP  Result Date: 01/05/2021 ----------------------------------------------------------------------  OBSTETRICS REPORT                       (Signed Final 01/05/2021 03:40 pm) ---------------------------------------------------------------------- Patient Info  ID #:       [id number]                          D.O.B.:  02-04-1991 (29 yrs)  Name:       [facility] Heath                Visit Date: 01/05/2021 03:19 pm ---------------------------------------------------------------------- Performed By  Attending:        Tama High MD        Ref. Address:     [address] [place], [place]  Performed By:     Jeanene Erb BS,      [facility]  Referred By:      [facility] [place] [facility] ---------------------------------------------------------------------- Orders  #  Description                           Code        Ordered By  1  [place] MFM OB FOLLOW UP                   54098.11    Maye Hides ----------------------------------------------------------------------  #  Order #                     Accession #                Episode #  1  [id number]                   [id number]                 [id number] ---------------------------------------------------------------------- Indications  Uterine size-date discrepancy, third trimester O26.843  Encounter for other antenatal screening        Z36.2  follow-up  [redacted] weeks gestation of pregnancy                Z3A.39 ---------------------------------------------------------------------- Fetal Evaluation  Num Of Fetuses:         1  Fetal Heart Rate(bpm):  137  Cardiac Activity:       Observed  Presentation:           Cephalic  Placenta:               Anterior Left lateral  P. Cord Insertion:      Previously Visualized  Amniotic Fluid  AFI FV:      Within normal limits  AFI Sum(cm)     %Tile       Largest Pocket(cm)  20.6            90          7.4  RUQ(cm)       RLQ(cm)       LUQ(cm)        LLQ(cm)  4.8           3.8           7.4            4.6 ---------------------------------------------------------------------- Biometry  BPD:        97  mm     G. Age:  39w 5d         84  %    CI:         81.3   %    70 - 86  FL/HC:      21.8   %    20.7 - 22.5  HC:      339.6  mm     G. Age:  39w 0d         27  %    HC/AC:      0.93        0.87 - 1.06  AC:      367.1  mm     G. Age:  40w 4d         90  %    FL/BPD:     76.2   %    71 - 87  FL:        73.9  mm     G. Age:  37w 6d         14  %    FL/AC:      20.1   %    20 - 24  Est. FW:    3885  gm      8 lb 9 oz     74  % ---------------------------------------------------------------------- OB History  Gravidity:    2         Term:   1        Prem:   0        SAB:   0  TOP:          0       Ectopic:  0        Living: 1 ---------------------------------------------------------------------- Gestational Age  Clinical EDD:  97w 6d                                        EDD:   01/06/21  U/S Today:     39w 2d                                        EDD:   01/10/21  Best:          39w 6d     Det. By:  Clinical EDD             EDD:   01/06/21 ---------------------------------------------------------------------- Anatomy  Cranium:               Previously seen        Aortic Arch:            Previously seen  Cavum:                 Previously seen        Ductal Arch:            Previously seen  Ventricles:            Previously seen        Diaphragm:              Appears normal  Choroid Plexus:        Previously seen        Stomach:                Appears normal, left  sided  Cerebellum:            Previously seen        Abdomen:                Appears normal  Posterior Fossa:       Previously seen        Abdominal Wall:         Previously seen  Nuchal Fold:           Not applicable (>86    Cord Vessels:           Previously seen                         wks GA)  Face:                  Orbits and profile     Kidneys:                Appear normal                         previously seen  Lips:                  Previously seen        Bladder:                Appears normal  Thoracic:              Appears normal         Spine:                  Previously seen  Heart:                 Appears normal         Upper Extremities:      Previously seen                         (4CH, axis, and                         situs)  RVOT:                  Previously seen         Lower Extremities:      Previously seen  LVOT:                  Previously seen  Other:  Fetus appears to be female. Heels, 5th digit, and Nasal bone prev          visualized. Technically difficult due to fetal position. ---------------------------------------------------------------------- Cervix Uterus Adnexa  Cervix  Not visualized (advanced GA >24wks) ---------------------------------------------------------------------- Impression  Uterine-size-date discrepancy .  Fetal growth is appropriate for gestational age .Amniotic fluid  is normal and good fetal activity is seen .Incidentally  observed BPP is reassuring (8/8).  Cephalic presentation. ---------------------------------------------------------------------- Recommendations  Follow-up scans as clinically indicated. ----------------------------------------------------------------------                  Tama High, MD Electronically Signed Final Report   01/05/2021 03:40 pm ----------------------------------------------------------------------   MAU Course  Procedures  MDM -pp vaginal bleeding -O Positive -clots present on arrival to MAU -cytotec and methergine given -BP elevated only after methergine -CBC: H/H 11.2/33.1, platelets 266 -[place]: Thickened heterogeneous endometrium without increased color Doppler flow. Findings are  nonspecific and could indicate endometrial blood products although hypovascular retained products of conception cannot be fully excluded. -consulted with Dr. Ilda Basset who recommends reevaluation of patient at 515PM to determine course of action -total blood loss at 505PM 563m -called and spoke with Dr. AHarolyn Rutherford as uterotonics are not working in MAU, will proceed to OR for D&C -prepare for OR  Orders Placed This Encounter  Procedures  . [place] (TRANSABDOMINAL ONLY)    Standing Status:   Standing    Number of Occurrences:   1    Order Specific Question:   Symptom/Reason for Exam    Answer:   Excessive  postpartum bleeding [[[id number]] . CBC    Standing Status:   Standing    Number of Occurrences:   1    Order Specific Question:   Specimen collection method    Answer:   Unit=Unit collect  . Diet NPO time specified    Standing Status:   Standing    Number of Occurrences:   1  . Type and screen [place]    Standing Status:   Standing    Number of Occurrences:   1  . Insert peripheral IV    Standing Status:   Standing    Number of Occurrences:   1   Meds ordered this encounter  Medications  . misoprostol (CYTOTEC) 200 MCG tablet    Cioce, Anna   : cabinet override  . misoprostol (CYTOTEC) tablet 800 mcg  . lactated ringers bolus 1,000 mL  . methylergonovine (METHERGINE) injection 0.2 mg  . methylergonovine (METHERGINE) 0.2 MG/ML injection    Cioce, Anna   : cabinet override    Assessment and Plan   1. Vaginal bleeding   2. Excessive postpartum bleeding    -prepare for OR  NGerrie NordmannNugent 01/25/2021, 5:13 PM

## 2021-01-25 NOTE — Progress Notes (Signed)
Received report from Holly, RN.

## 2021-01-25 NOTE — Transfer of Care (Signed)
Immediate Anesthesia Transfer of Care Note  Patient: Lisa Heath  Procedure(s) Performed: DILATATION AND EVACUATION AND PLACEMENT OF BAKRI BALLOON. (N/A Uterus)  Patient Location: PACU  Anesthesia Type:General  Level of Consciousness: awake, alert  and oriented  Airway & Oxygen Therapy: Patient Spontanous Breathing and Patient connected to face mask oxygen  Post-op Assessment: Report given to RN and Post -op Vital signs reviewed and stable  Post vital signs: Reviewed and stable  Last Vitals:  Vitals Value Taken Time  BP 152/81 01/25/21 2040  Temp 36.2 C 01/25/21 2040  Pulse 68 01/25/21 2040  Resp 14 01/25/21 2040  SpO2 100 % 01/25/21 2040    Last Pain:  Vitals:   01/25/21 2040  TempSrc: Oral  PainSc: 0-No pain      Patients Stated Pain Goal: 4 (01/25/21 2040)  Complications: No complications documented.

## 2021-01-25 NOTE — Anesthesia Preprocedure Evaluation (Signed)
Anesthesia Evaluation  Patient identified by MRN, date of birth, ID band Patient awake    Reviewed: Allergy & Precautions, NPO status , Patient's Chart, lab work & pertinent test results  Airway Mallampati: II  TM Distance: >3 FB Neck ROM: Full    Dental  (+) Teeth Intact   Pulmonary neg pulmonary ROS,    Pulmonary exam normal        Cardiovascular negative cardio ROS   Rhythm:Regular Rate:Normal     Neuro/Psych negative neurological ROS  negative psych ROS   GI/Hepatic negative GI ROS, Neg liver ROS,   Endo/Other  negative endocrine ROS  Renal/GU negative Renal ROS   Postpartum hemorrhage     Musculoskeletal negative musculoskeletal ROS (+)   Abdominal (+)  Abdomen: soft. Bowel sounds: normal.  Peds  Hematology negative hematology ROS (+)   Anesthesia Other Findings   Reproductive/Obstetrics negative OB ROS                             Anesthesia Physical Anesthesia Plan  ASA: II  Anesthesia Plan: General   Post-op Pain Management:    Induction: Intravenous  PONV Risk Score and Plan: 3 and Ondansetron, Aprepitant, Midazolam and Treatment may vary due to age or medical condition  Airway Management Planned: Mask and LMA  Additional Equipment: None  Intra-op Plan:   Post-operative Plan: Extubation in OR  Informed Consent: I have reviewed the patients History and Physical, chart, labs and discussed the procedure including the risks, benefits and alternatives for the proposed anesthesia with the patient or authorized representative who has indicated his/her understanding and acceptance.     Dental advisory given  Plan Discussed with: CRNA  Anesthesia Plan Comments: (Lab Results      Component                Value               Date                      WBC                      6.9                 01/25/2021                HGB                      11.2 (L)             01/25/2021                HCT                      33.1 (L)            01/25/2021                MCV                      87.1                01/25/2021                PLT                      266  01/25/2021           Covid-19 Nucleic Acid Test Results Lab Results      Component                Value               Date                      [id number]              POSITIVE (A)        01/11/2021          )        Anesthesia Quick Evaluation

## 2021-01-25 NOTE — Anesthesia Postprocedure Evaluation (Signed)
Anesthesia Post Note  Patient: Lisa Heath  Procedure(s) Performed: DILATATION AND EVACUATION AND PLACEMENT OF BAKRI BALLOON. (N/A Uterus)     Patient location during evaluation: PACU Anesthesia Type: General Level of consciousness: awake and alert Pain management: pain level controlled Vital Signs Assessment: post-procedure vital signs reviewed and stable Respiratory status: spontaneous breathing, nonlabored ventilation, respiratory function stable and patient connected to nasal cannula oxygen Cardiovascular status: blood pressure returned to baseline and stable Postop Assessment: no apparent nausea or vomiting Anesthetic complications: no   No complications documented.  Last Vitals:  Vitals:   01/25/21 2100 01/25/21 2136  BP:  125/77  Pulse: 68 (!) 59  Resp: 15 16  Temp: (!) 36.3 C 36.8 C  SpO2: 100% 99%    Last Pain:  Vitals:   01/25/21 2136  TempSrc: Oral  PainSc: 6                  Sinaya Min[place] P Naftoli Penny

## 2021-01-26 ENCOUNTER — Encounter (HOSPITAL_COMMUNITY): Payer: Self-pay | Admitting: Obstetrics & Gynecology

## 2021-01-26 LAB — BPAM RBC
Blood Product Expiration Date: 202203132359
Blood Product Expiration Date: 202203192359
ISSUE DATE / TIME: 202202152006
ISSUE DATE / TIME: 202202152006
Unit Type and Rh: 5100
Unit Type and Rh: 5100

## 2021-01-26 LAB — BASIC METABOLIC PANEL
Anion gap: 10 (ref 5–15)
BUN: 8 mg/dL (ref 6–20)
CO2: 23 mmol/L (ref 22–32)
Calcium: 8.4 mg/dL — ABNORMAL LOW (ref 8.9–10.3)
Chloride: 105 mmol/L (ref 98–111)
Creatinine, Ser: 0.57 mg/dL (ref 0.44–1.00)
GFR, Estimated: >60 mL/min (ref >60)
Glucose, Bld: 112 mg/dL — ABNORMAL HIGH (ref 70–99)
Potassium: 4.6 mmol/L (ref 3.5–5.1)
Sodium: 138 mmol/L (ref 135–145)

## 2021-01-26 LAB — TYPE AND SCREEN
ABO/RH(D): O POS
Antibody Screen: NEGATIVE
Unit division: 0
Unit division: 0

## 2021-01-26 LAB — CBC
HCT: 33.9 % — ABNORMAL LOW (ref 36.0–46.0)
Hemoglobin: 11.2 g/dL — ABNORMAL LOW (ref 12.0–15.0)
MCH: 29.7 pg (ref 26.0–34.0)
MCHC: 33 g/dL (ref 30.0–36.0)
MCV: 89.9 fL (ref 80.0–100.0)
Platelets: 203 10*3/uL (ref 150–400)
RBC: 3.77 MIL/uL — ABNORMAL LOW (ref 3.87–5.11)
RDW: 13.1 % (ref 11.5–15.5)
WBC: 10 10*3/uL (ref 4.0–10.5)
nRBC: 0 % (ref 0.0–0.2)

## 2021-01-26 MED ORDER — DOCUSATE SODIUM 100 MG PO CAPS: 100.0000 mg | ORAL_CAPSULE | Freq: Two times a day (BID) | ORAL | 1 refills | Status: DC | PRN

## 2021-01-26 MED ORDER — OXYCODONE HCL 5 MG PO TABS: 5.0000 mg | ORAL_TABLET | ORAL | 0 refills | Status: DC | PRN

## 2021-01-26 MED ORDER — IBUPROFEN 800 MG PO TABS: 800.0000 mg | ORAL_TABLET | Freq: Three times a day (TID) | ORAL | 1 refills | Status: DC | PRN

## 2021-01-26 NOTE — Progress Notes (Signed)
    Faculty Practice OB/GYN Attending Note  Subjective:  Came to evaluate patient after surgery. She is in bed, comfortable, husband at her side.  Arabic [employer] interpreter used.  Minimal pain reported, no other symptoms.   Admitted on 01/25/2021 for PPH (postpartum hemorrhage) and she underwent D&E, Bakri placement.    Objective:  Blood pressure 126/78, pulse (!) 57, temperature 98.3 F (36.8 C), temperature source Oral, resp. rate 16, weight 55.1 kg, SpO2 99 %, unknown if currently breastfeeding.   Intake/Output Summary (Last 24 hours) at 01/26/2021 0037 Last data filed at 01/25/2021 2220 Gross per 24 hour  Intake 2265 ml  Output 1704 ml  Net 561 ml   Total I/O In: 2265 [I.V.:1000; Blood:665; IV Piggyback:600] Out: 1050 [Urine:250; Blood:800] Minimal output from Bakri noted  Gen: NAD HENT: Normocephalic, atraumatic Lungs: Normal respiratory effort Heart: Regular rate noted Abdomen: NT, soft Pelvic: Deferred, Bakri in place Ext: 2+ DTRs, no edema, no cyanosis, negative Homan's sign  Assessment & Plan:  30 y.o. G3T5176 POD#1 D&E and Bakri placement for delayed PPH after vaginal delivery on 01/12/2021.  EBL 1.5L, has received 2 units pRBCs. Doing well.  Reviewed operative course and findings with patient and her husband. All questions answered.  Will check CBC in a few hours, start deflation of Bakri around 7 am.  If patient remains stable, she can be discharged to home later this morning.    Continue close observation for now.   Jaynie Collins, MD, FACOG Obstetrician & Gynecologist, [facility] for [employer], [facility]

## 2021-01-26 NOTE — Progress Notes (Signed)
Discharge instructions and prescriptions given to pt. Discussed signs and symptoms to report to the MD, upcoming appointments, and meds. Pt verbalizes understanding and has no questions at this time. Pt discharged home from hospital in stable condition.  

## 2021-01-26 NOTE — Discharge Instructions (Signed)
Dilation and Curettage or Vacuum Curettage, Care After  This sheet gives you information about how to care for yourself after your procedure. Your health care provider may also give you more specific instructions. If you have problems or questions, contact your health care provider.  What can I expect after the procedure?  After the procedure, it is common to have:  · Mild pain or cramping.  · Some vaginal bleeding or spotting.  These may last for up to 2 weeks after your procedure.  Follow these instructions at home:  Medicines  · Take over-the-counter and prescription medicines only as told by your health care provider. This is especially important if you take blood-thinning medicine.  · Ask your health care provider if the medicine prescribed to you requires you to avoid driving or using machinery.  Activity  · If you were given a sedative during the procedure, it can affect you for several hours. Do not drive or operate machinery until your health care provider says that it is safe.  · Rest as told by your health care provider.  · Avoid sitting for a long time without moving. Get up to take short walks every 1-2 hours. This is important to improve blood flow and breathing. Ask for help if you feel weak or unsteady.  · Do not lift anything that is heavier than 10 lb (4.5 kg), or the limit that you are told, until your health care provider says that it is safe.  · Return to your normal activities as told by your health care provider. Ask your health care provider what activities are safe for you.  Lifestyle  For at least 2 weeks, or as long as told by your health care provider, do not:  · Douche.  · Use tampons.  · Have sex.  General instructions  · Wear compression stockings as told by your health care provider. These stockings help to prevent blood clots and reduce swelling in your legs.  · It is up to you to get the results of your procedure. Ask your health care provider, or the department that is doing the  procedure, when your results will be ready.  · Keep all follow-up visits as told by your health care provider. This is important.  Contact a health care provider if:  · You have severe cramps that get worse or that do not get better with medicine.  · You have severe pain in the abdomen.  · You cannot drink fluids without vomiting.  · You develop pain in a different area of your pelvis.  · You have bad-smelling discharge from the vagina.  · You have a rash.  Get help right away if:  · You have vaginal bleeding that soaks more than one sanitary pad in 1 hour for 2 hours in a row, or you pass large clots from your vagina.  · You have a fever that is above 100.4°F (38.0°C).  · Your abdomen feels very tender or hard.  · You have chest pain.  · You have shortness of breath.  · You feel dizzy or light-headed, or you faint.  · You have pain in your neck or shoulder area.  These symptoms may represent a serious problem that is an emergency. Do not wait to see if the symptoms will go away. Get medical help right away. Call your local emergency services (911 in the [place] Do not drive yourself to the hospital.  Summary  · After your procedure, it is common   anything that is heavier than 10 lb (4.5 kg), or the limit that you are told.  Monitor yourself for any complications that may develop after the procedure.  Keep all follow-up visits as told by your health care provider. Know the symptoms for which you should get help right away. This information is not intended to replace advice given to you by your health care provider. Make sure you discuss any questions you have with your health care provider. Document Revised: 12/30/2019 Document Reviewed: 12/30/2019 Elsevier Patient Education  2021 Elsevier Inc.    Postpartum  hemorrhage. Elsevier.">  Postpartum Hemorrhage Postpartum hemorrhage is losing too much blood after childbirth. Bleeding (lochia) after vaginal or cesarean delivery is normal and should be expected. Menstrual or period-like bleeding will occur for several days after childbirth. However, postpartum hemorrhage is very heavy bleeding over a shorter period of time. This is a serious condition. Postpartum hemorrhage usually occurs within the first 24 hours after delivery. However, delayed postpartum hemorrhage can happen after you leave the hospital. This is rare. What are the causes? This condition is caused by:  A loss of muscle tone in the uterus after childbirth.  Failure to deliver all of the placenta.  Trauma to the uterus, cervix, or vagina during delivery.  A disorder that prevents blood from clotting. This is rare. What increases the risk? This condition is likely to develop in women who:  Have a history of postpartum hemorrhage.  Have a long labor or need medicine to make contractions stronger.  Have a large baby (fetal macrosomia).  Have a hypertensive disorder of pregnancy. These include high blood pressure, preeclampsia, or eclampsia.  Have complications during labor, such as an infection or heavy bleeding.  Are obese.  Are pregnant with more than one baby (twins or more). What are the signs or symptoms? Symptoms of this condition include:  Passing large clots or pieces of tissue. These may be small pieces of placenta left after delivery.  Soaking more than one sanitary pad per hour.  Severe pain near the vagina or perineum (retroperitoneal hematoma).  A drop in blood pressure.  Light-headedness or fainting. How is this diagnosed? This condition may be diagnosed based on:  Your symptoms.  A physical exam of your perineum, vagina, cervix, and uterus.  Weighing of blood-soaked perineal pads or items to determine the amount of blood loss.  Tests,  including: ? Blood pressure and pulse measurements. ? Blood tests. ? An ultrasound. How is this treated? Treatment for this condition depends on the severity of your symptoms. It may include:  Massage of the uterus and manual removal of blood clots.  Medicines to help the uterus return to normal size.  Inserting a balloon that puts pressure on the walls of the uterus (uterine tamponade).  Blood transfusions and fluids given through an IV that is inserted into one of your veins.  A medical procedure to compress arteries that supply blood to the uterus. Sometimes bleeding occurs if portions of the placenta are left behind in the uterus after delivery. If this happens, a curettage or scraping of the inside of the uterus must be done. This usually stops the bleeding. If curettage does not stop the bleeding, surgery may be done to remove the uterus (hysterectomy). This surgery is rare. Other treatments may be needed if bleeding is caused by clotting or bleeding problems that are not related to the pregnancy. Follow these instructions at home: Activity  Rest as told by your health care provider.  Limit your activity as directed by your health care provider. Lifestyle  Do not use tampons.  Do not douche or have sexual intercourse until your health care provider approves. General instructions  Take over-the-counter and prescription medicines only as told by your health care provider.  Drink enough fluid to keep your urine pale yellow.  Eat foods that are rich in iron, such as spinach, red meat, and legumes.  Keep track of the number of pads you use each day and how soaked they are. Write down this information.  Keep all follow-up visits as told by your health care provider. This is important.      Contact a health care provider if:  You have a fever.  You are using an increasing number of sanitary pads per hour.  Your bleeding changes from brownish or pink to bright  red.  Your heart rate is faster than usual. Get help right away if you:  Experience severe cramps in your stomach, back, or abdomen.  Pass large clots or tissue. Save any tissue for your health care provider to look at.  Have heavy bleeding that soaks one pad in an hour (or less) for 2 hours.  Faint or become dizzy, weak, or light-headed.  Are urinating less than usual or not at all.  Have shortness of breath or difficulty breathing.  Have sudden chest pain. These symptoms may represent a serious problem that is an emergency. Do not wait to see if the symptoms will go away. Get medical help right away. Call your local emergency services (911 in the [place] Do not drive yourself to the hospital. Summary  Postpartum hemorrhage is losing too much blood after childbirth.  Postpartum hemorrhage usually occurs within the first 24 hours after delivery. Delayed postpartum hemorrhage can happen after you leave the hospital. This is rare.  Follow your health care provider's instructions about activity and diet. Drink plenty of fluid and eat foods that are rich in iron.  Tell your health care provider right away if you have heavy bleeding that soaks one pad in an hour (or less) for 2 hours.  Get help right away if you have severe cramps, shortness of breath, or sudden chest pain. This information is not intended to replace advice given to you by your health care provider. Make sure you discuss any questions you have with your health care provider. Document Revised: 08/27/2019 Document Reviewed: 08/27/2019 Elsevier Patient Education  2021 [employer].

## 2021-01-26 NOTE — Discharge Summary (Signed)
Gynecology Physician Postoperative Discharge Summary  Patient ID: Lisa Heath MRN: [id number] DOB/AGE: 1991/11/20 30 y.o.  Admit Date: 01/25/2021 Discharge Date: 01/26/2021  Preoperative Diagnoses: Delayed postpartum hemorrhage after vaginal delivery on 01/12/2021, concern about retained products of conception  Procedures: Procedure(s) (LRB): DILATATION AND EVACUATION AND PLACEMENT OF Ethan. (N/A)  Hospital Course:  Lisa Heath is a 30 y.o. G2P2002 who presented to [facility]U on 01/25/2021 with increased postpartum bleeding after vaginal delivery on 01/12/2021.  Bleeding not ameliorated after uterotonic treatment, had EBL of about 700 ml in [facility]U.   She then underwent the procedures as mentioned above, her operation was complicated by further increased blood loss due to atony not responsive to uterotonics.  EBL was about 800 ml in OR, Bakri balloon placed with 200 ml of fluid.  Given total EBL of 1.5 L and hemoglobin drop from 11.2 to 8.8, she was crossmatched for and transfused with two units of pRBCs.  For further details about surgery, please refer to the operative report. Patient had an uncomplicated postoperative course.  On POD#1, the Bakri balloon was deflated as per protocol, and removed successfully.  No further significant bleeding was noted. Post transfusion hemoglobin was 11.2.   By time of discharge on POD#1, her pain was controlled on oral pain medications; she was ambulating, voiding without difficulty, tolerating regular diet and passing flatus. She was deemed stable for discharge to home with outpatient follow up.   Significant Labs: CBC Latest Ref Rng & Units 01/26/2021 01/25/2021 01/25/2021  WBC 4.0 - 10.5 K/uL 10.0 - 6.9  Hemoglobin 12.0 - 15.0 g/dL 11.2(L) 8.8(L) 11.2(L)  Hematocrit 36.0 - 46.0 % 33.9(L) 26.0(L) 33.1(L)  Platelets 150 - 400 K/uL 203 - 266   Discharge Exam: Blood pressure 119/72, pulse (!) 58, temperature 98.1 F (36.7 C), resp. rate 16, weight 55.1 kg,  SpO2 100 %, unknown if currently breastfeeding. General appearance: alert and no distress  Resp: clear to auscultation bilaterally  Cardio: regular rate and rhythm  GI: soft, non-tender; bowel sounds normal; no masses, no organomegaly.  Pelvic: scant blood on pad  Extremities: extremities normal, atraumatic, no cyanosis or edema and Homans sign is negative, no sign of DVT  Discharged Condition: Stable  Discharge disposition: 01-Home or Self Care   Allergies as of 01/26/2021   No Known Allergies     Medication List    TAKE these medications   acetaminophen 500 MG tablet Commonly known as: TYLENOL Take 2 tablets (1,000 mg total) by mouth every 6 (six) hours as needed (for pain scale < 4).   ascorbic acid 250 MG tablet Commonly known as: VITAMIN C Take 1 tablet (250 mg total) by mouth every other day.   Blood Pressure Kit Devi 1 Device by Does not apply route daily. ICD 10: Z34.00   docusate sodium 100 MG capsule Commonly known as: COLACE Take 1 capsule (100 mg total) by mouth 2 (two) times daily as needed for mild constipation or moderate constipation.   ferrous sulfate 325 (65 FE) MG tablet Take 1 tablet (325 mg total) by mouth every other day.   ibuprofen 800 MG tablet Commonly known as: ADVIL Take 1 tablet (800 mg total) by mouth 3 (three) times daily with meals as needed for headache, moderate pain or cramping. What changed:   medication strength  how much to take  when to take this  reasons to take this   oxyCODONE 5 MG immediate release tablet Commonly known as: Oxy IR/ROXICODONE Take 1  tablet (5 mg total) by mouth every 4 (four) hours as needed for severe pain or breakthrough pain.      No future appointments.  Follow-up [facility] Follow up in 2 week(s).   Specialty: Obstetrics and Gynecology Why: You will be contacted with appointment details. Call office or come to [facility]U for any  concerns before this appointment  Contact information: [place] [phone]              Total discharge time: 20 minutes   Signed:  Verita Schneiders, MD, [place] Attending [place], [facility]

## 2021-01-26 NOTE — Progress Notes (Deleted)
0800: 50cc removed from [facility] balloon.

## 2021-01-28 LAB — SURGICAL PATHOLOGY

## 2021-02-14 ENCOUNTER — Other Ambulatory Visit: Payer: Self-pay

## 2021-02-14 ENCOUNTER — Ambulatory Visit (INDEPENDENT_AMBULATORY_CARE_PROVIDER_SITE_OTHER): Payer: Medicaid Other | Admitting: Family Medicine

## 2021-02-14 ENCOUNTER — Encounter: Payer: Self-pay | Admitting: Family Medicine

## 2021-02-14 DIAGNOSIS — Z3202 Encounter for pregnancy test, result negative: Secondary | ICD-10-CM | POA: Diagnosis not present

## 2021-02-14 LAB — POCT PREGNANCY, URINE: Preg Test, Ur: NEGATIVE

## 2021-02-14 MED ORDER — NORGESTIMATE-ETH ESTRADIOL 0.25-35 MG-MCG PO TABS: 1.0000 | ORAL_TABLET | Freq: Every day | ORAL | 4 refills | Status: DC

## 2021-02-14 NOTE — Progress Notes (Signed)
Post Partum Visit Note  Lisa Heath is a 30 y.o. G65P2002 female who presents for a postpartum visit. She is 4 weeks postpartum following a normal spontaneous vaginal delivery.  I have fully reviewed the prenatal and intrapartum course. The delivery was at 40.6 gestational weeks.  Anesthesia: epidural. Postpartum course has been complicated by retained POC after discharge and PPH with 1.5 L blood loss (required cytotec, [place], methergine). Baby is doing well. Baby is feeding by bottle - Similac with Iron. Bleeding staining only. Bowel function is normal. Bladder function is normal. Patient is sexually active. Contraception method is none. Postpartum depression screening: negative.   The pregnancy intention screening data noted above was reviewed. Potential methods of contraception were discussed. The patient elected to proceed with Oral Contraceptive.    Edinburgh Postnatal Depression Scale - 02/14/21 1611      Edinburgh Postnatal Depression Scale:  In the Past 7 Days   I have been able to laugh and see the funny side of things. 0    I have looked forward with enjoyment to things. 0    I have blamed myself unnecessarily when things went wrong. 0    I have been anxious or worried for no good reason. 0    I have felt scared or panicky for no good reason. 0    Things have been getting on top of me. 0    I have been so unhappy that I have had difficulty sleeping. 0    I have felt sad or miserable. 0    I have been so unhappy that I have been crying. 0    The thought of harming myself has occurred to me. 0    Edinburgh Postnatal Depression Scale Total 0           The following portions of the patient's history were reviewed and updated as appropriate: allergies, current medications, past family history, past medical history, past social history, past surgical history and problem list.  Review of Systems Pertinent items are noted in HPI.    Objective:  BP 119/78   Pulse 85   Ht 5'  2" (1.575 m)   Wt 116 lb 8 oz (52.8 kg)   Breastfeeding No   BMI 21.31 kg/m    General:  alert, cooperative and appears stated age   Breasts:  not inpsected  Lungs: clear to auscultation bilaterally  Heart:  regular rate and rhythm, S1, S2 normal, no murmur, click, rub or gallop  Abdomen: soft, non-tender; bowel sounds normal; no masses,  no organomegaly. Uterus below pelvic rim, unable to palpate   Vulva:  not evaluated  Vagina: not evaluated  Cervix:  not evaluated  Corpus: not examined  Adnexa:  not evaluated  Rectal Exam: Not performed.        Assessment:    normal postpartum exam. Pap smear not done at today's visit.   Plan:   Essential components of care per ACOG recommendations:  1.  Mood and well being: Patient with negative depression screening today. Reviewed local resources for support.  - Patient does not use tobacco.  - hx of drug use? No    2. Infant care and feeding:  -Patient currently breastmilk feeding? No  -Social determinants of health (SDOH) reviewed in EPIC. No concerns  3. Sexuality, contraception and birth spacing - Patient does not want a pregnancy in the next year.  Desired family size is 2 children.  - Reviewed forms of contraception in tiered  fashion. Patient desired oral contraceptives (estrogen/progesterone) today.  We discussed other options and review how to take OCP. She will pick up and start today. We discusseded backing up ocp for at least 2 weeks. - Discussed birth spacing of 18 months  4. Sleep and fatigue -Encouraged family/partner/community support of 4 hrs of uninterrupted sleep to help with mood and fatigue  5. Physical Recovery  - Discussed patients delivery and complications which included a pp DC and PPH 1.5L (delayed) and s/p transfusion.  - Patient had a 1st degree laceration, perineal healing reviewed. Patient expressed understanding - Patient has urinary incontinence? No - Patient is safe to resume physical and sexual  activity  6.  Health Maintenance - Last pap smear- 06/2020  7. Chronic Disease - Anemia- continue fe sulfate, recommend hg in 3-4 month - PCP follow up  Federico Flake, MD [facility], [facility]

## 2021-03-09 ENCOUNTER — Encounter: Payer: Self-pay | Admitting: General Practice

## 2021-03-09 IMAGING — US US MFM UA CORD DOPPLER
1 series · 14 of 28 positions shown · non-contrast
Comparison: none

[Series 1: us mfm ua cord doppler · 35 acquisitions, 14 frames shown]
[im 2/35]
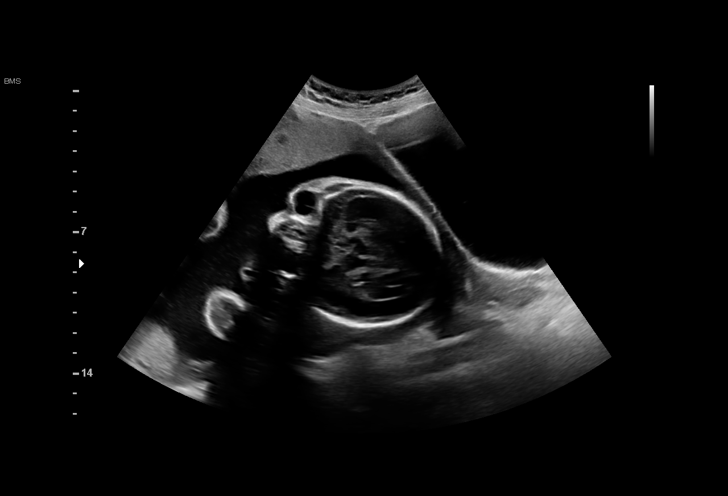
[im 4/35]
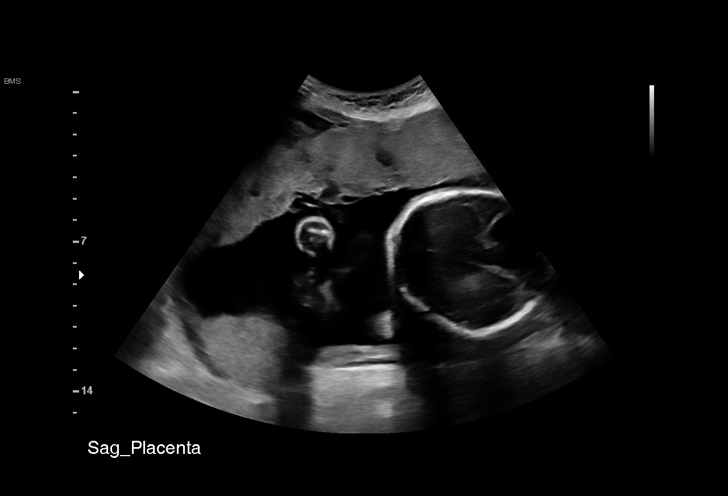
[im 7/35]
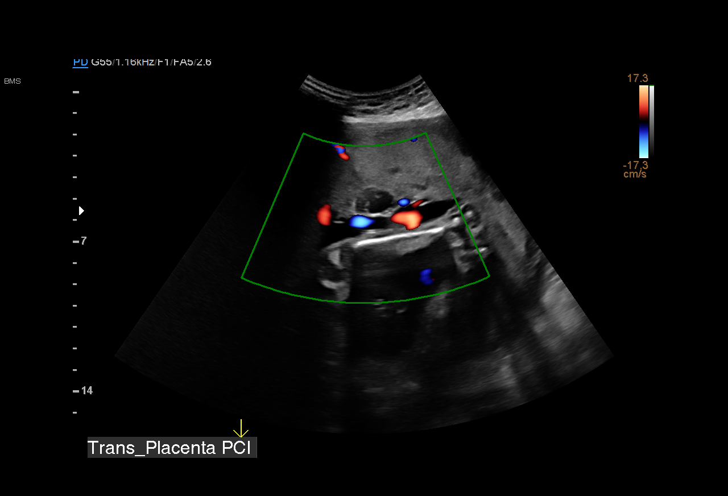
[im 9/35]
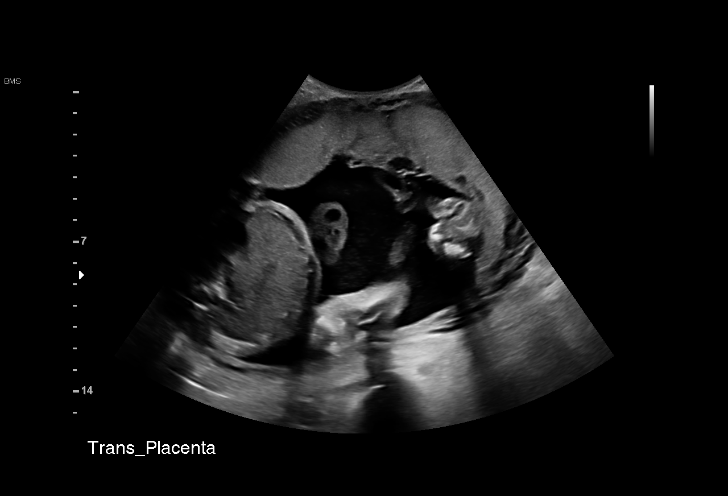
[im 12/35]
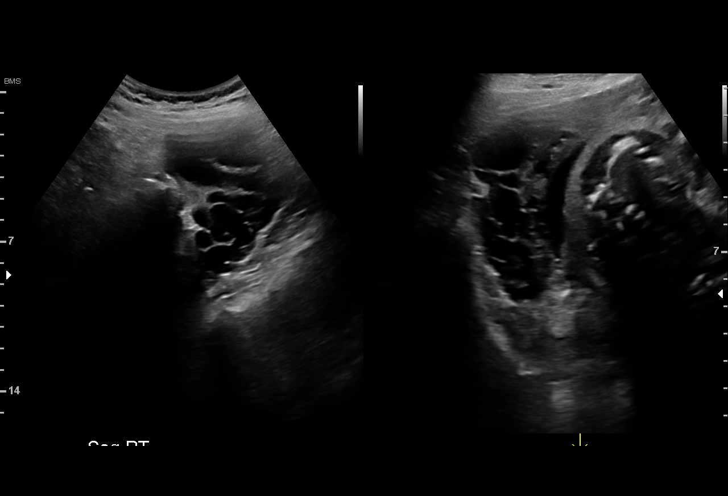
[im 14/35]
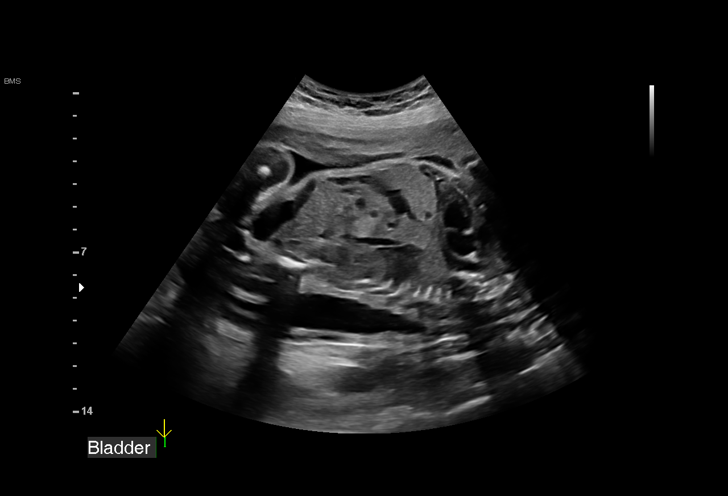
[im 17/35]
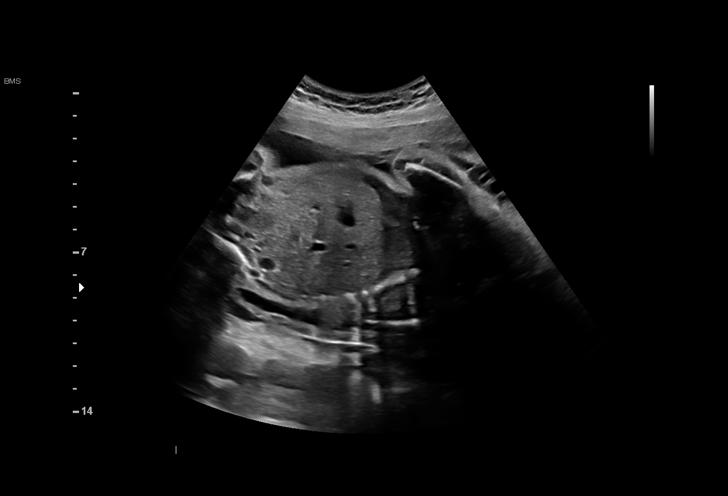
[im 19/35]
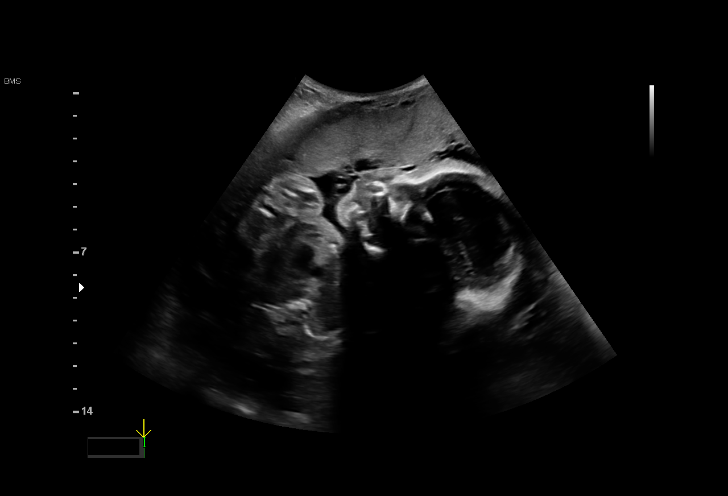
[im 22/35]
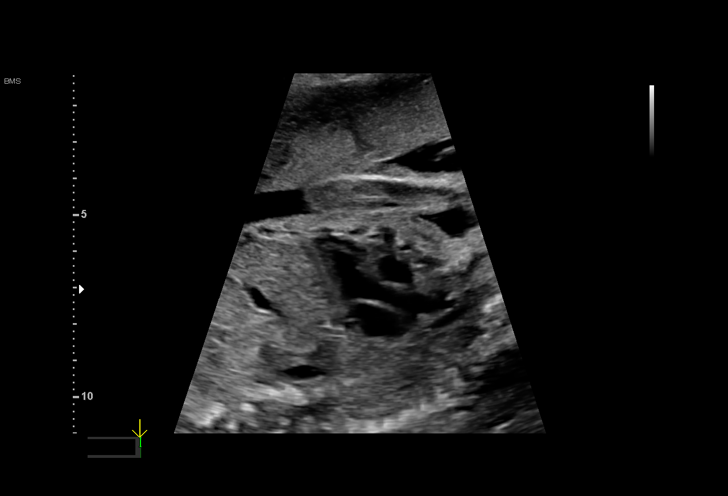
[im 24/35]
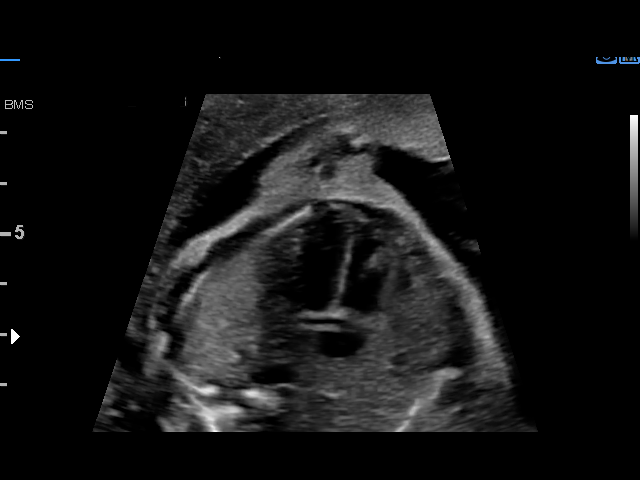
[im 27/35]
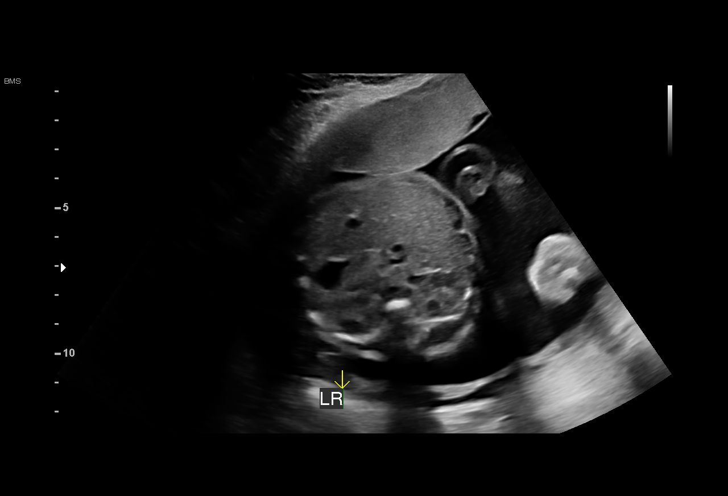
[im 29/35]
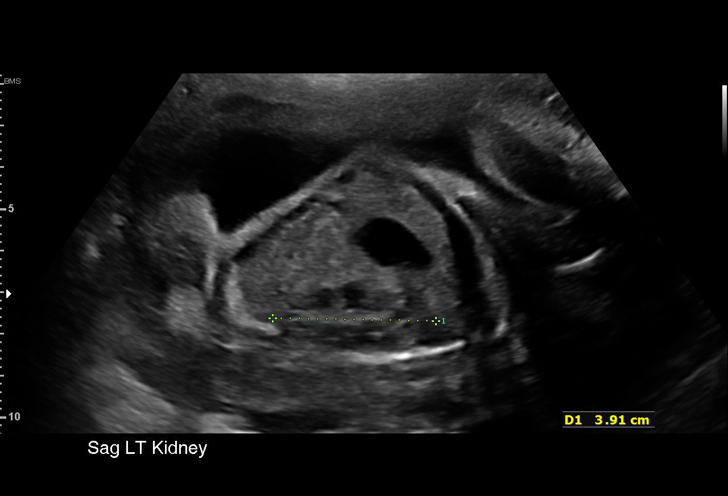
[im 32/35]
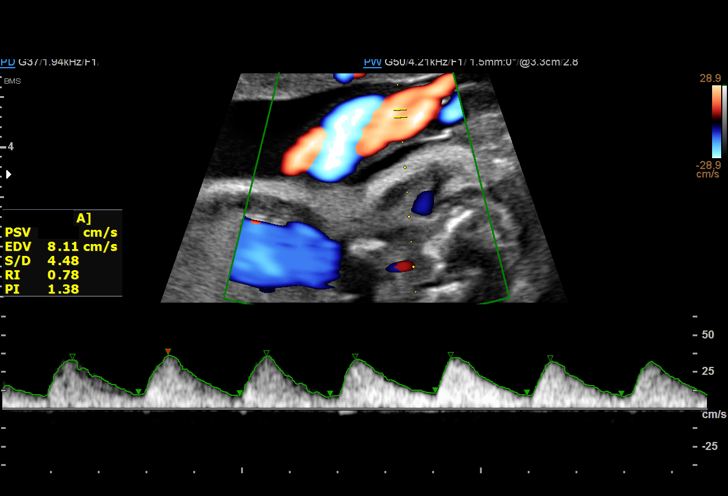
[im 35/35]
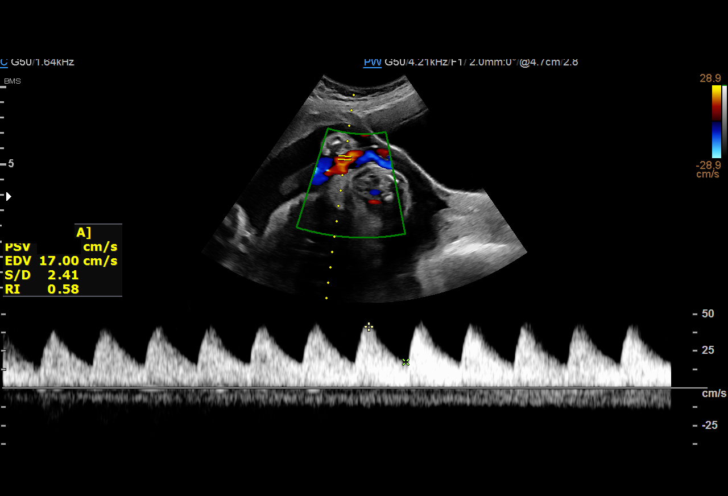

[14 of 28 positions shown; findings below may reference images not displayed]

[REDACTED]-
                   Faculty Physician

 1  US MFM UA CORD DOPPLER                76820.02    FERIENHAUS
                                                      NAVAS

Indications

 Encounter for antenatal screening for
 malformations
 Maternal care for known or suspected poor
 fetal growth, second trimester, not applicable
 or unspecified IUGR
 27 weeks gestation of pregnancy
Fetal Evaluation

 Num Of Fetuses:         1
 Fetal Heart Rate(bpm):  143
 Cardiac Activity:       Observed
 Presentation:           Cephalic
 Placenta:               Anterior Left Lateral
 P. Cord Insertion:      Visualized, central

 Amniotic Fluid
 AFI FV:      Within normal limits

                             Largest Pocket(cm)

Biometry
 LV:        5.1  mm
OB History

 Gravidity:    2         Term:   1        Prem:   0        SAB:   0
 TOP:          0       Ectopic:  0        Living: 1
Gestational Age

 Clinical EDD:  27w 1d                                        EDD:   01/06/21
 Best:          27w 1d     Det. By:  Clinical EDD             EDD:   01/06/21
Anatomy

 Ventricles:            Appears normal         Abdomen:                Appears normal
 Diaphragm:             Appears normal         Kidneys:                Appear normal
 Stomach:               Appears normal, left   Bladder:                Appears normal
                        sided
Doppler - Fetal Vessels

 Umbilical Artery
  S/D     %tile      RI    %tile                             ADFV    RDFV
  3.06       47    0.67       51                                No      No

Cervix Uterus Adnexa

 Cervix
 Not visualized (advanced GA >29wks)

 Uterus
 No abnormality visualized.

 Right Ovary
 Not visualized.

 Left Ovary
 Not visualized.

 Cul De Sac
 No free fluid seen.

 Adnexa
 No adnexal mass visualized.
Impression

 Fetal growth restriction.  On ultrasound performed last week,
 the estimated fetal weight was at the 9th percentile.

 On today's ultrasound, amniotic fluid is normal good fetal
 activity seen.  Umbilical artery Doppler showed normal
 forward diastolic flow.  NST is reactive.

 We reassured the patient of the findings.  Since fetal growth
 restriction was not severe, I recommend a follow-up
 ultrasound in 2 weeks
Recommendations

 Fetal growth assessment, UA Doppler and NST in 2 weeks.
                 Waldman, Farideh

## 2021-06-26 IMAGING — US US PELVIS COMPLETE
1 series · 15 of 25 positions shown · non-contrast
Comparison: None.

CLINICAL DATA: Excessive vaginal bleeding after delivery.

EXAM:
TRANSABDOMINAL ULTRASOUND OF PELVIS
TECHNIQUE: Transabdominal ultrasound examination of the pelvis was performed
including evaluation of the uterus, ovaries, adnexal regions, and
pelvic cul-de-sac.

[Series 1: us pelvis complete · 15 of 40 slices shown]
[im 1/40]
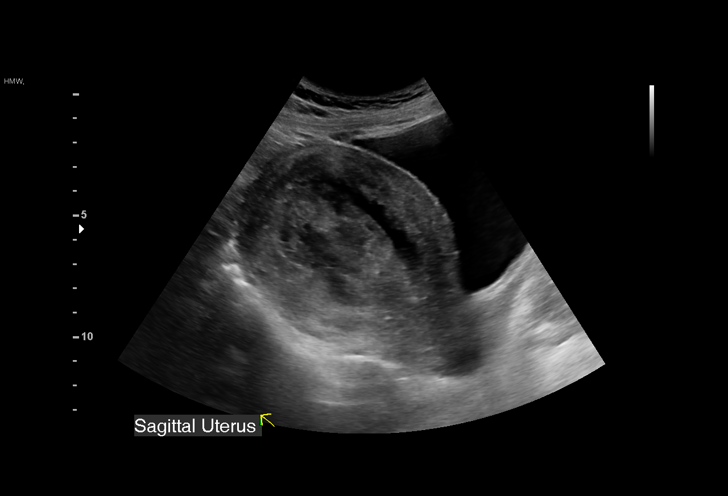
[im 4/40]
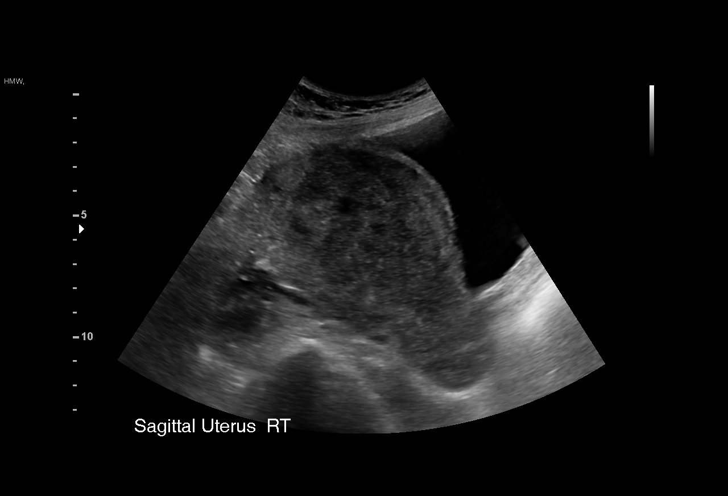
[im 7/40]
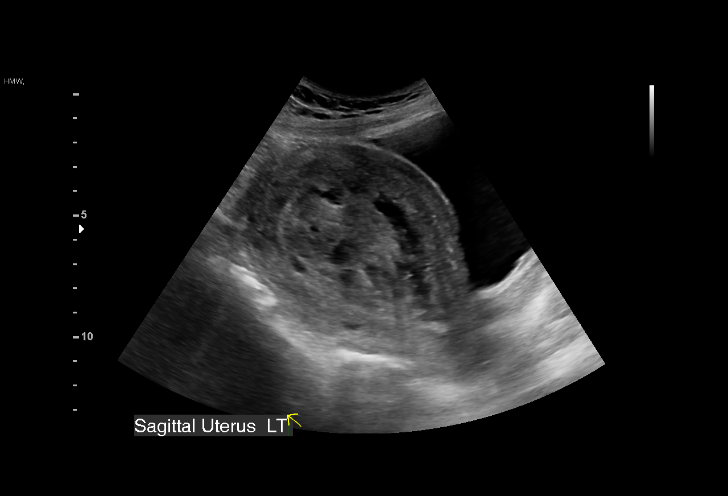
[im 9/40]
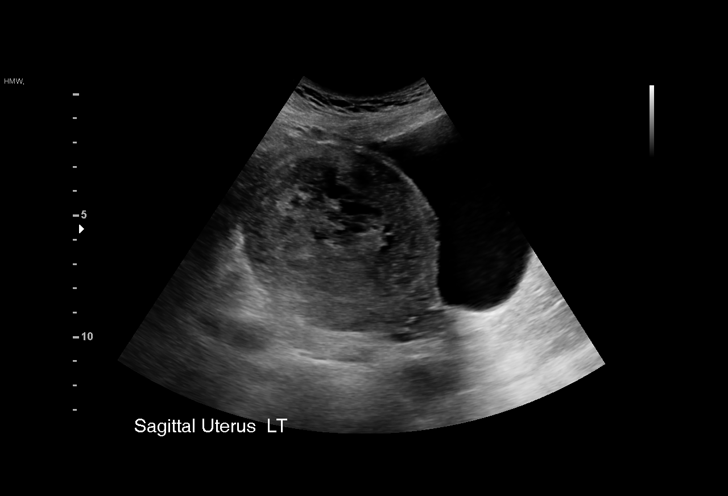
[im 12/40]
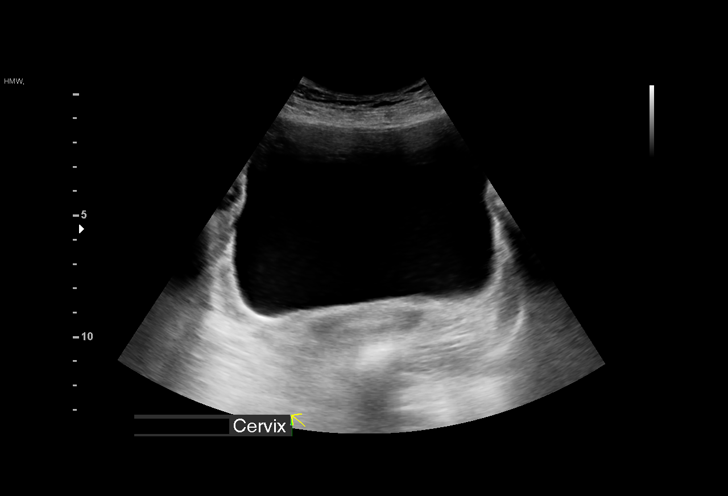
[im 15/40]
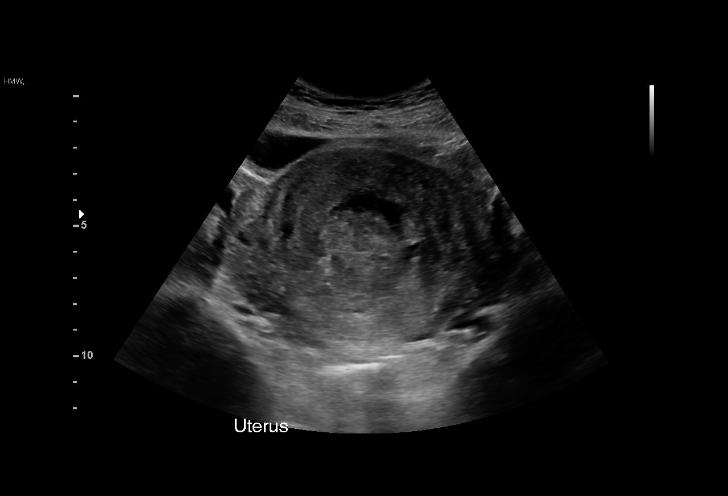
[im 17/40]
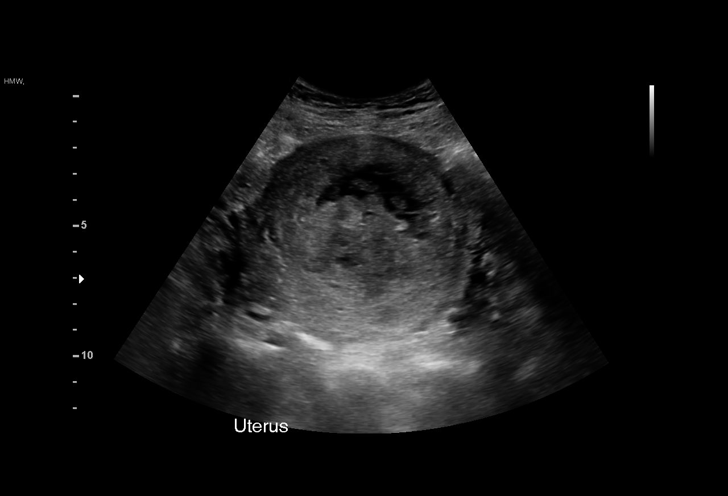
[im 20/40]
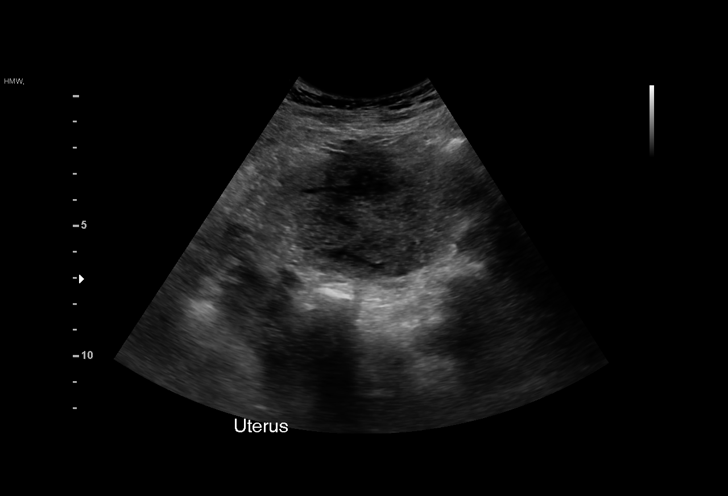
[im 23/40]
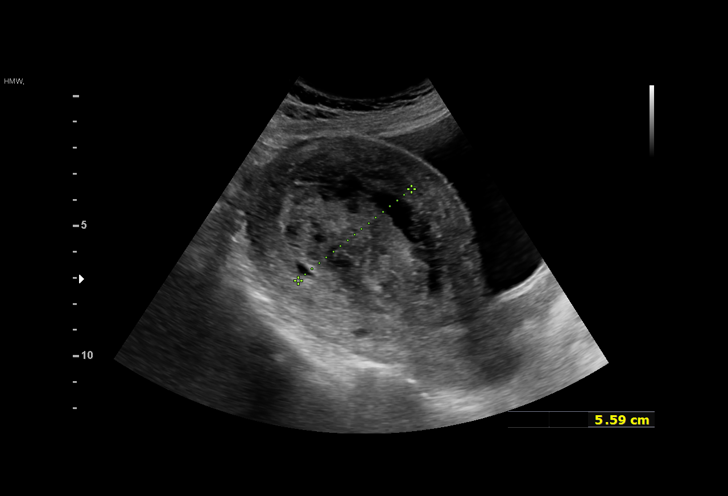
[im 25/40]
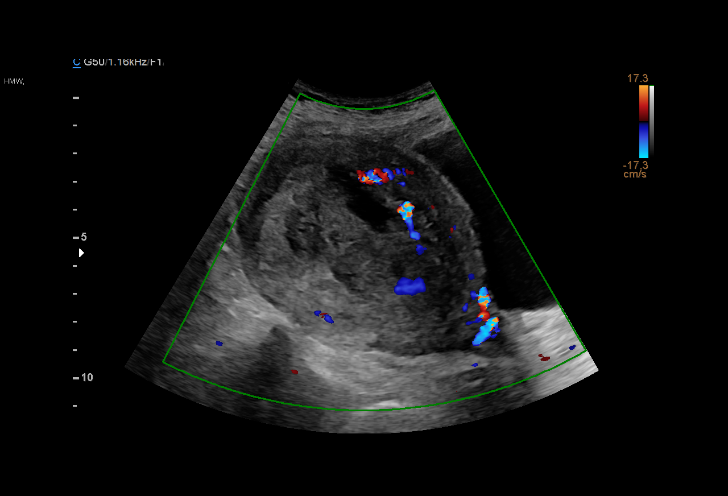
[im 28/40]
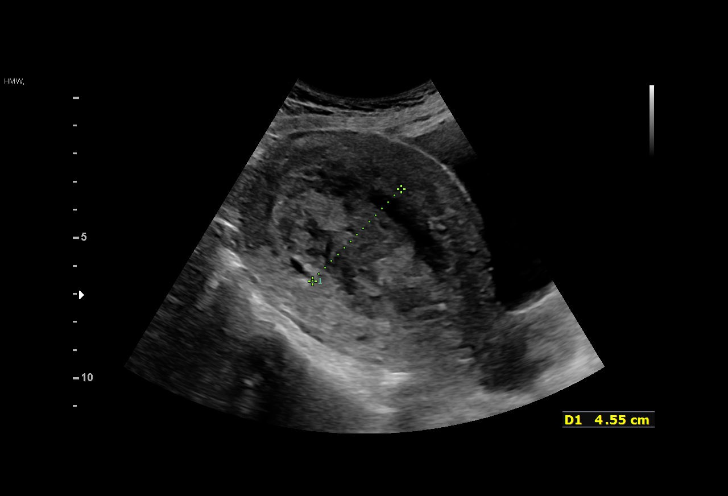
[im 31/40]
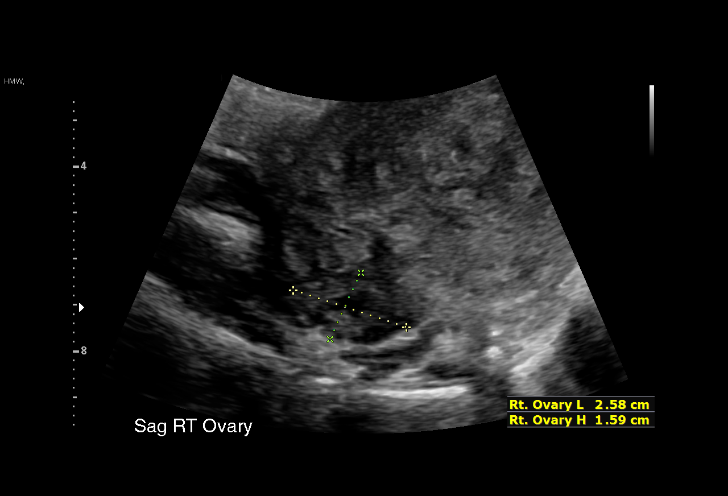
[im 33/40]
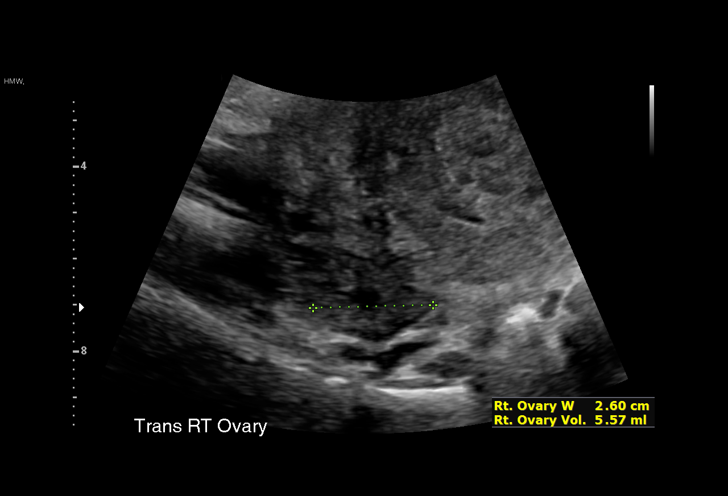
[im 36/40]
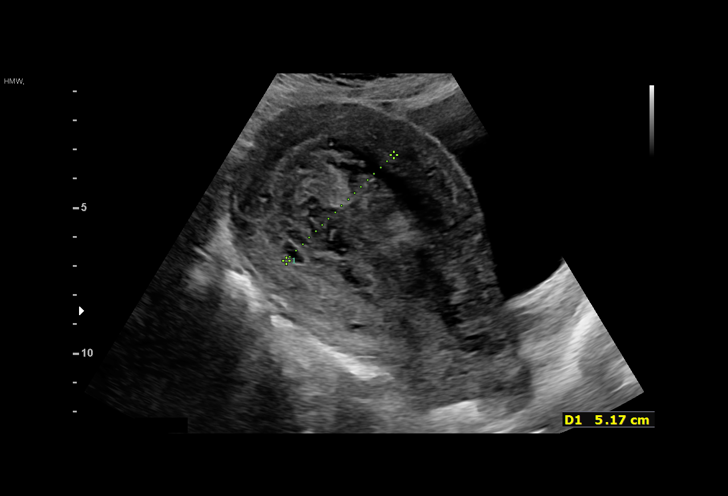
[im 40/40]
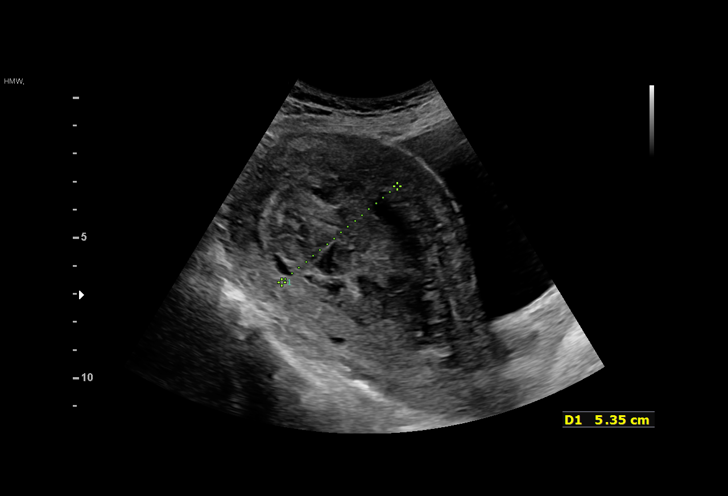

[15 of 25 positions shown; findings below may reference images not displayed]

FINDINGS: Uterus

Measurements: 12 x 8.3 x 8.6 cm = volume: 443 mL. No fibroids or
other mass visualized.

Endometrium

Thickness: 34 mm. The endometrium is thickened and heterogeneous.
There is no evidence for increased color Doppler flow within the
endometrium.

Right ovary

Measurements: 2.6 x 1.6 x 2.6 cm = volume: 5.6 mL. Normal
appearance/no adnexal mass.

Left ovary

Not visualized

Other findings:  No abnormal free fluid.
IMPRESSION: 1. Thickened heterogeneous endometrium without increased color
Doppler flow. Findings are nonspecific and could indicate
endometrial blood products although hypovascular retained products
of conception cannot be fully excluded. Consider short-term
follow-up pelvic ultrasound or further evaluation with pelvic MRI
without and with IV contrast, as clinically warranted.
2. Unremarkable appearance of the right ovary. Nonvisualization of
the left ovary.

## 2022-07-20 ENCOUNTER — Telehealth: Payer: Self-pay

## 2022-07-21 MED ORDER — NORGESTIMATE-ETH ESTRADIOL 0.25-35 MG-MCG PO TABS: 1.0000 | ORAL_TABLET | Freq: Every day | ORAL | 0 refills | Status: DC

## 2022-07-21 NOTE — Telephone Encounter (Signed)
Fax prescription request received for birth control pills. Called pt with [employer] interpreter [employer] ID [phone]. Patient states she is still taking birth control pills. Refill sent for three months. Front office notified to schedule annual exam within 3 months.

## 2022-09-20 ENCOUNTER — Telehealth: Payer: Self-pay | Admitting: Family Medicine

## 2022-09-20 NOTE — Telephone Encounter (Signed)
With the help of an interpreter we called to schedule an annual Nov/Dec but there was no answer, we left a message for the patient to call back.

## 2022-10-09 ENCOUNTER — Other Ambulatory Visit: Payer: Self-pay

## 2022-11-23 ENCOUNTER — Encounter: Payer: Self-pay | Admitting: *Deleted

## 2022-11-23 ENCOUNTER — Other Ambulatory Visit: Payer: Self-pay | Admitting: *Deleted

## 2022-11-23 MED ORDER — NORGESTIMATE-ETH ESTRADIOL 0.25-35 MG-MCG PO TABS: 1.0000 | ORAL_TABLET | Freq: Every day | ORAL | 1 refills | Status: DC

## 2022-11-23 NOTE — Progress Notes (Unsigned)
Fax request for refill of birth control pills received from [facility].  Per chart review, pt is due for Annual Gyn exam. An attempt to contact pt and schedule appointment was made on 09/20/22 however she did not answer and did not return our call. I called pt and discussed her request. She stated that she is going out of the country and will return on February 28. I advised that I can send in a refill. She will need to contact office for appointment in March or April. Pt voiced understanding.

## 2023-05-21 ENCOUNTER — Telehealth: Payer: Self-pay | Admitting: Lactation Services

## 2023-05-21 NOTE — Telephone Encounter (Addendum)
Received refill request for OCP's. Patient last seen in office in March 2022.   Called patient with assistance of [employer] telephone Arabic Interpreter, # [phone] to inform her that prescription cannot be refilled as needed to have a follow up appointment in office.  Patient did not answer her mobile number. LM for her to call the office in regards to a prescription refill. Called her home number, left same message on answering machine.    Letter created and sent to patient.

## 2023-06-05 ENCOUNTER — Other Ambulatory Visit: Payer: Self-pay

## 2023-06-05 MED ORDER — NORGESTIMATE-ETH ESTRADIOL 0.25-35 MG-MCG PO TABS: 1.0000 | ORAL_TABLET | Freq: Every day | ORAL | 1 refills | Status: DC

## 2023-11-07 ENCOUNTER — Encounter: Payer: Self-pay | Admitting: Obstetrics & Gynecology

## 2023-11-07 ENCOUNTER — Other Ambulatory Visit (HOSPITAL_COMMUNITY)
Admission: RE | Admit: 2023-11-07 | Discharge: 2023-11-07 | Disposition: A | Discharge status: Home / Self Care | Payer: Medicaid Other | Source: Ambulatory Visit | Attending: Obstetrics & Gynecology | Admitting: Obstetrics & Gynecology

## 2023-11-07 ENCOUNTER — Other Ambulatory Visit: Payer: Self-pay

## 2023-11-07 ENCOUNTER — Ambulatory Visit: Payer: Medicaid Other | Admitting: Obstetrics & Gynecology

## 2023-11-07 VITALS — BP 129/84 | HR 84 | Ht 62.0 in | Wt 165.0 lb

## 2023-11-07 DIAGNOSIS — R87810 Cervical high risk human papillomavirus (HPV) DNA test positive: Secondary | ICD-10-CM | POA: Insufficient documentation

## 2023-11-07 LAB — POCT PREGNANCY, URINE: Preg Test, Ur: NEGATIVE

## 2023-11-07 NOTE — Progress Notes (Signed)
    GYNECOLOGY OFFICE COLPOSCOPY PROCEDURE NOTE  32 y.o. [id number] here for colposcopy for pap with normal cytology and positive HPV mRNA E6/E7 (equivalent to positive HPV 16 or 18) on 09/05/23 at [facility].  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 Patient is Arabic-speaking only, interpreter present for this encounter.  Patient gave informed written consent, time out was performed.  Placed in lithotomy position. Cervix viewed with speculum and colposcope after application of acetic acid.   Colposcopy adequate? Yes Faint acetowhite lesion noted at 11 o'clock; corresponding biopsy obtained.  ECC specimen obtained. All specimens were labeled and sent to pathology.  Chaperone was present during entire procedure.  Patient was given post procedure instructions.  Will follow up pathology and manage accordingly; patient will be contacted with results and recommendations.  Routine preventative health maintenance measures emphasized.    Jaynie Collins, MD, FACOG Obstetrician & Gynecologist, [facility] for [employer], [facility]

## 2023-11-09 LAB — SURGICAL PATHOLOGY

## 2023-11-15 ENCOUNTER — Telehealth: Payer: Self-pay

## 2023-11-15 NOTE — Telephone Encounter (Addendum)
-----   Message from Elkton Anyanwu sent at 11/13/2023 12:00 PM EST ----- Benign glandular cells but limited squamous cells for evaluation, no abnormalities seen.   Repeat pap smear recommended in 02/2023 given history of cervical dysplasia. Can be done at [facility] or [facility].  Please call to inform patient of results and recommendations. Arabic speaking.   Left message with [facility] Interpreter # [phone] stating that her results from 11/07/23 are normal and we request that she call the office in one year for follow up.  If you have any questions to please give the office a call.   Fawna Cranmer,RN  11/15/23

## 2024-01-09 ENCOUNTER — Other Ambulatory Visit: Payer: Self-pay | Admitting: *Deleted

## 2024-01-09 MED ORDER — NORGESTIMATE-ETH ESTRADIOL 0.25-35 MG-MCG PO TABS: 1.0000 | ORAL_TABLET | Freq: Every day | ORAL | 3 refills | Status: DC

## 2024-12-12 ENCOUNTER — Encounter: Payer: Self-pay | Admitting: Lactation Services

## 2024-12-12 ENCOUNTER — Other Ambulatory Visit: Payer: Self-pay | Admitting: Lactation Services

## 2024-12-12 MED ORDER — NORGESTIMATE-ETH ESTRADIOL 0.25-35 MG-MCG PO TABS: 1.0000 | ORAL_TABLET | Freq: Every day | ORAL | 0 refills | Status: AC

## 2024-12-12 NOTE — Progress Notes (Signed)
 Rx for OCP's renewed for 3 months. Mychart message sent to patient that annual exam needs to be scheduled for further refills.
